# Patient Record
Sex: Female | Born: 1950
Health system: Southern US, Community
[De-identification: ages and names within clinical notes are randomized; demographics above are authoritative.]

## PROBLEM LIST (undated history)

## (undated) DIAGNOSIS — H269 Unspecified cataract: Secondary | ICD-10-CM

## (undated) DIAGNOSIS — K5790 Diverticulosis of intestine, part unspecified, without perforation or abscess without bleeding: Secondary | ICD-10-CM

## (undated) DIAGNOSIS — I10 Essential (primary) hypertension: Secondary | ICD-10-CM

## (undated) DIAGNOSIS — T7840XA Allergy, unspecified, initial encounter: Secondary | ICD-10-CM

## (undated) DIAGNOSIS — C4490 Unspecified malignant neoplasm of skin, unspecified: Secondary | ICD-10-CM

## (undated) DIAGNOSIS — G43909 Migraine, unspecified, not intractable, without status migrainosus: Secondary | ICD-10-CM

## (undated) DIAGNOSIS — D649 Anemia, unspecified: Secondary | ICD-10-CM

## (undated) DIAGNOSIS — Z5189 Encounter for other specified aftercare: Secondary | ICD-10-CM

## (undated) DIAGNOSIS — M1990 Unspecified osteoarthritis, unspecified site: Secondary | ICD-10-CM

## (undated) DIAGNOSIS — M8580 Other specified disorders of bone density and structure, unspecified site: Secondary | ICD-10-CM

## (undated) DIAGNOSIS — K219 Gastro-esophageal reflux disease without esophagitis: Secondary | ICD-10-CM

## (undated) HISTORY — DX: Unspecified cataract: H26.9

## (undated) HISTORY — DX: Gastro-esophageal reflux disease without esophagitis: K21.9

## (undated) HISTORY — DX: Allergy, unspecified, initial encounter: T78.40XA

## (undated) HISTORY — PX: POLYPECTOMY: SHX149

## (undated) HISTORY — DX: Unspecified osteoarthritis, unspecified site: M19.90

## (undated) HISTORY — DX: Anemia, unspecified: D64.9

## (undated) HISTORY — DX: Unspecified malignant neoplasm of skin, unspecified: C44.90

## (undated) HISTORY — DX: Other specified disorders of bone density and structure, unspecified site: M85.80

## (undated) HISTORY — DX: Migraine, unspecified, not intractable, without status migrainosus: G43.909

## (undated) HISTORY — PX: OOPHORECTOMY: SHX86

## (undated) HISTORY — DX: Encounter for other specified aftercare: Z51.89

## (undated) HISTORY — PX: OTHER SURGICAL HISTORY: SHX169

## (undated) HISTORY — PX: COLONOSCOPY: SHX174

## (undated) HISTORY — PX: TONSILLECTOMY: SUR1361

## (undated) HISTORY — DX: Essential (primary) hypertension: I10

## (undated) HISTORY — PX: ABDOMINAL HYSTERECTOMY: SHX81

## (undated) HISTORY — PX: TUBAL LIGATION: SHX77

---

## 2000-11-09 ENCOUNTER — Encounter: Payer: Self-pay | Admitting: Internal Medicine

## 2007-04-20 ENCOUNTER — Emergency Department (HOSPITAL_COMMUNITY): Admission: EM | Admit: 2007-04-20 | Discharge: 2007-04-20 | Payer: Self-pay | Admitting: Emergency Medicine

## 2007-04-25 ENCOUNTER — Encounter: Admission: RE | Admit: 2007-04-25 | Discharge: 2007-07-18 | Payer: Self-pay | Admitting: Orthopedic Surgery

## 2007-11-06 ENCOUNTER — Ambulatory Visit: Payer: Self-pay | Admitting: Internal Medicine

## 2007-11-22 ENCOUNTER — Ambulatory Visit: Payer: Self-pay | Admitting: Internal Medicine

## 2007-12-02 ENCOUNTER — Ambulatory Visit: Payer: Self-pay | Admitting: Internal Medicine

## 2007-12-02 DIAGNOSIS — R030 Elevated blood-pressure reading, without diagnosis of hypertension: Secondary | ICD-10-CM | POA: Insufficient documentation

## 2007-12-03 DIAGNOSIS — J309 Allergic rhinitis, unspecified: Secondary | ICD-10-CM | POA: Insufficient documentation

## 2007-12-03 DIAGNOSIS — R519 Headache, unspecified: Secondary | ICD-10-CM | POA: Insufficient documentation

## 2007-12-03 DIAGNOSIS — Z85828 Personal history of other malignant neoplasm of skin: Secondary | ICD-10-CM | POA: Insufficient documentation

## 2007-12-03 DIAGNOSIS — R51 Headache: Secondary | ICD-10-CM | POA: Insufficient documentation

## 2008-05-14 ENCOUNTER — Ambulatory Visit: Payer: Self-pay | Admitting: Internal Medicine

## 2008-11-11 LAB — CONVERTED CEMR LAB: Pap Smear: NORMAL

## 2009-10-08 ENCOUNTER — Ambulatory Visit: Payer: Self-pay | Admitting: Internal Medicine

## 2009-10-08 DIAGNOSIS — M25559 Pain in unspecified hip: Secondary | ICD-10-CM | POA: Insufficient documentation

## 2009-10-08 LAB — CONVERTED CEMR LAB
ALT: 24 units/L (ref 0–35)
AST: 26 units/L (ref 0–37)
Alkaline Phosphatase: 66 units/L (ref 39–117)
BUN: 13 mg/dL (ref 6–23)
Basophils Absolute: 0 10*3/uL (ref 0.0–0.1)
Bilirubin, Direct: 0.1 mg/dL (ref 0.0–0.3)
Calcium: 9.6 mg/dL (ref 8.4–10.5)
Creatinine, Ser: 0.8 mg/dL (ref 0.4–1.2)
Eosinophils Relative: 1.5 % (ref 0.0–5.0)
GFR calc non Af Amer: 83.2 mL/min (ref >60)
Glucose, Bld: 79 mg/dL (ref 70–99)
HCT: 40.3 % (ref 36.0–46.0)
HDL: 70.3 mg/dL (ref >39.00)
Lymphocytes Relative: 33.5 % (ref 12.0–46.0)
Monocytes Relative: 8.1 % (ref 3.0–12.0)
Neutrophils Relative %: 56.4 % (ref 43.0–77.0)
Platelets: 201 10*3/uL (ref 150.0–400.0)
Potassium: 4.2 meq/L (ref 3.5–5.1)
RDW: 14.2 % (ref 11.5–14.6)
TSH: 0.75 microintl units/mL (ref 0.35–5.50)
Total Bilirubin: 1.1 mg/dL (ref 0.3–1.2)
Triglycerides: 45 mg/dL (ref 0.0–149.0)
VLDL: 9 mg/dL (ref 0.0–40.0)
WBC: 5.8 10*3/uL (ref 4.5–10.5)

## 2009-10-10 ENCOUNTER — Encounter: Payer: Self-pay | Admitting: Internal Medicine

## 2009-11-25 ENCOUNTER — Encounter: Payer: Self-pay | Admitting: Internal Medicine

## 2009-11-26 ENCOUNTER — Encounter: Payer: Self-pay | Admitting: Internal Medicine

## 2009-11-30 ENCOUNTER — Encounter: Payer: Self-pay | Admitting: Internal Medicine

## 2009-12-01 ENCOUNTER — Encounter: Payer: Self-pay | Admitting: Internal Medicine

## 2009-12-01 ENCOUNTER — Telehealth: Payer: Self-pay | Admitting: Internal Medicine

## 2009-12-10 ENCOUNTER — Encounter: Payer: Self-pay | Admitting: Internal Medicine

## 2009-12-10 ENCOUNTER — Encounter: Admission: RE | Admit: 2009-12-10 | Discharge: 2009-12-10 | Payer: Self-pay | Admitting: Obstetrics and Gynecology

## 2009-12-14 ENCOUNTER — Telehealth: Payer: Self-pay | Admitting: Internal Medicine

## 2010-02-08 ENCOUNTER — Telehealth: Payer: Self-pay | Admitting: Internal Medicine

## 2010-02-14 ENCOUNTER — Ambulatory Visit
Admission: RE | Admit: 2010-02-14 | Discharge: 2010-02-14 | Payer: Self-pay | Source: Home / Self Care | Attending: Internal Medicine | Admitting: Internal Medicine

## 2010-02-14 ENCOUNTER — Encounter: Payer: Self-pay | Admitting: Internal Medicine

## 2010-02-14 DIAGNOSIS — G43009 Migraine without aura, not intractable, without status migrainosus: Secondary | ICD-10-CM | POA: Insufficient documentation

## 2010-02-14 DIAGNOSIS — N390 Urinary tract infection, site not specified: Secondary | ICD-10-CM | POA: Insufficient documentation

## 2010-02-19 ENCOUNTER — Encounter: Payer: Self-pay | Admitting: Internal Medicine

## 2010-02-19 ENCOUNTER — Encounter: Payer: Self-pay | Admitting: Obstetrics and Gynecology

## 2010-02-20 ENCOUNTER — Encounter: Payer: Self-pay | Admitting: Internal Medicine

## 2010-02-24 ENCOUNTER — Ambulatory Visit
Admission: RE | Admit: 2010-02-24 | Discharge: 2010-02-24 | Payer: Self-pay | Source: Home / Self Care | Attending: Internal Medicine | Admitting: Internal Medicine

## 2010-02-24 ENCOUNTER — Encounter: Payer: Self-pay | Admitting: Internal Medicine

## 2010-02-24 ENCOUNTER — Other Ambulatory Visit: Payer: Self-pay | Admitting: Internal Medicine

## 2010-02-24 DIAGNOSIS — E876 Hypokalemia: Secondary | ICD-10-CM | POA: Insufficient documentation

## 2010-02-24 DIAGNOSIS — D72829 Elevated white blood cell count, unspecified: Secondary | ICD-10-CM | POA: Insufficient documentation

## 2010-02-24 DIAGNOSIS — K5900 Constipation, unspecified: Secondary | ICD-10-CM | POA: Insufficient documentation

## 2010-02-24 LAB — CBC WITH DIFFERENTIAL/PLATELET
Basophils Absolute: 0 10*3/uL (ref 0.0–0.1)
Basophils Relative: 0.4 % (ref 0.0–3.0)
HCT: 38.7 % (ref 36.0–46.0)
Hemoglobin: 13.3 g/dL (ref 12.0–15.0)
Lymphs Abs: 2.2 10*3/uL (ref 0.7–4.0)
MCHC: 34.4 g/dL (ref 30.0–36.0)
Monocytes Relative: 7.1 % (ref 3.0–12.0)
Neutro Abs: 2.9 10*3/uL (ref 1.4–7.7)
RBC: 4.62 Mil/uL (ref 3.87–5.11)
RDW: 13.5 % (ref 11.5–14.6)

## 2010-02-24 LAB — BASIC METABOLIC PANEL
CO2: 29 mEq/L (ref 19–32)
GFR: 89.88 mL/min (ref >60.00)
Glucose, Bld: 91 mg/dL (ref 70–99)
Potassium: 4.1 mEq/L (ref 3.5–5.1)
Sodium: 138 mEq/L (ref 135–145)

## 2010-03-01 NOTE — Progress Notes (Signed)
    PAP Screening:    Hx Cervical Dysplasia in last 5 yrs? Yes    3 normal PAP smears in last 5 yrs? No    Last PAP smear:  11/26/2009  PAP Smear Results:    Date of Exam:  11/26/2009    Results:  Normal  Mammogram Screening:    Last Mammogram:  11/17/2008  Osteoporosis Risk Assessment:  Risk Factors for Fracture or Low Bone Density:   Race (White or Asian):     yes   Hx of Fractures:       no   FH of Osteoporosis:     no   Hx of falls:       no   Physically inactive:     no   Smoking status:       never   High alcohol use:     no   Low calcium/Vit. D intake:   no   Corticosteroid use:     no   Heparin use:       no   Thyroid disease:     no   Rheumatoid Arthritis:     no  Immunization & Chemoprophylaxis:    Tetanus vaccine: given  (01/30/2006)

## 2010-03-01 NOTE — Letter (Signed)
Summary: Letter from Patient with Health Screening Results  Letter from Patient with Health Screening Results   Imported By: Lennie Odor 11/29/2009 09:10:10  _____________________________________________________________________  External Attachment:    Type:   Image     Comment:   External Document

## 2010-03-01 NOTE — Assessment & Plan Note (Signed)
Summary: CPX/ MAY COME FASTING OR COME THE NEXT DAY FOR LABS/NWS  #--Rm 8   Vital Signs:  Patient profile:   60 year old female Height:      65 inches Weight:      165.04 pounds BMI:     27.56 O2 Sat:      98 % on Room air Temp:     97.4 degrees F oral Pulse rate:   72 / minute Pulse rhythm:   regular Resp:     16 per minute BP sitting:   140 / 80  (left arm) Cuff size:   regular  Vitals Entered By: Mervin Kung CMA Duncan Dull) (October 08, 2009 1:17 PM)  Nutrition Counseling: Patient's BMI is greater than 25 and therefore counseled on weight management options.  O2 Flow:  Room air CC: Rm 8  Pt here for physical, fasting. Also has bilateral hip pain., Preventive Care Is Patient Diabetic? No   Primary Care Provider:  Etta Grandchild MD  CC:  Rm 8  Pt here for physical, fasting. Also has bilateral hip pain., and Preventive Care.  History of Present Illness: She returns for a complete physical but she offers the complaint of bilateral hip pain for many years that has worsened some recently.  Preventive Screening-Counseling & Management  Alcohol-Tobacco     Alcohol drinks/day: 0     Smoking Status: never     Passive Smoke Exposure: no     Tobacco Counseling: not indicated; no tobacco use  Hep-HIV-STD-Contraception     Hepatitis Risk: no risk noted     HIV Risk: no risk noted     STD Risk: no risk noted     Dental Visit-last 6 months yes     Dental Care Counseling: to seek dental care; no dental care within six months     SBE monthly: yes     SBE Education/Counseling: to perform regular SBE     Sun Exposure-Excessive: no  Safety-Violence-Falls     Seat Belt Use: yes     Helmet Use: yes     Firearms in the Home: no firearms in the home     Smoke Detectors: yes     Violence in the Home: no risk noted     Sexual Abuse: no      Sexual History:  currently monogamous.        Drug Use:  never.        Blood Transfusions:  no.    Clinical Review  Panels:  Prevention   Last Mammogram:  Normal Bilateral (11/17/2008)   Last Pap Smear:  Normal (11/11/2008)   Last Colonoscopy:  Location:  [facility].  (11/22/2007)  Immunizations   Last Tetanus Booster:  given (01/30/2006)   Allergies: 1)  ! Tetracycline 2)  ! Morphine 3)  ! 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 Family History of Colon CA 1st degree relative <60 Family  History Hypertension Family History Kidney disease  Social History: Reviewed history from 12/02/2007 and no changes required. Occupation: Engineer, civil (consulting) in [employer] Married Never Smoked Alcohol use-no Drug use-no Regular exercise-yes Hepatitis Risk:  no risk noted HIV Risk:  no risk noted STD Risk:  no risk noted Dental Care w/in 6 mos.:  yes Sun Exposure-Excessive:  no Seat Belt Use:  yes Sexual History:  currently monogamous Drug Use:  never Blood Transfusions:  no  Review of Systems  The patient denies anorexia, fever, weight loss, weight gain, chest pain, syncope, dyspnea on exertion, peripheral edema, prolonged cough, headaches, hemoptysis, abdominal pain, melena, hematochezia, severe indigestion/heartburn, hematuria, suspicious skin lesions, difficulty walking,  depression, enlarged lymph nodes, angioedema, and breast masses.   MS:  Complains of joint pain and stiffness; denies joint redness, joint swelling, loss of strength, low back pain, mid back pain, muscle aches, and muscle weakness.  Physical Exam  General:  alert, well-developed, well-nourished, well-hydrated, appropriate dress, normal appearance, healthy-appearing, and cooperative to examination.   Head:  Normocephalic and atraumatic without obvious abnormalities. No apparent alopecia or balding. Eyes:  No corneal or conjunctival inflammation noted. EOMI. Perrla. Funduscopic exam benign, without hemorrhages, exudates or papilledema. Vision grossly normal. Mouth:  Oral mucosa and oropharynx without lesions or exudates.  Teeth in good repair. Neck:  supple, full ROM, and no masses.   Lungs:  Normal respiratory effort, chest expands symmetrically. Lungs are clear to auscultation, no crackles or wheezes. Heart:  Normal rate and regular rhythm. S1 and S2 normal without gallop, murmur, click, rub or other extra sounds. Abdomen:  soft, non-tender, normal bowel sounds, no distention, no masses, no hepatomegaly, and no splenomegaly.   Msk:  normal ROM, no joint tenderness, no joint swelling, no joint warmth, no redness over joints, no joint deformities, no joint instability, no crepitation, and no muscle atrophy.   Pulses:  R and L carotid,radial,femoral,dorsalis pedis and posterior tibial pulses are full and equal bilaterally Extremities:  No clubbing, cyanosis, edema, or deformity noted with normal full range of motion of all joints.   Neurologic:  alert & oriented X3, cranial nerves II-XII intact, strength normal in all extremities, sensation intact to light touch, sensation intact to pinprick, gait normal, and DTRs symmetrical and normal.   Skin:  turgor normal, color normal, no rashes, no suspicious lesions, no ecchymoses, no petechiae, and no purpura.   Cervical Nodes:  no anterior cervical  adenopathy and no posterior cervical adenopathy.   Axillary Nodes:  no R axillary adenopathy and no L axillary adenopathy.   Inguinal Nodes:  no R inguinal adenopathy and no L inguinal adenopathy.   Psych:  Cognition and judgment appear intact. Alert and cooperative with normal attention span and concentration. No apparent delusions, illusions, hallucinations   Impression & Recommendations:  Problem # 1:  ROUTINE GENERAL MEDICAL EXAM@HEALTH  CARE FACL (ICD-V70.0) Assessment New  Orders: Venipuncture (29562) TLB-Lipid Panel (80061-LIPID) TLB-BMP (Basic Metabolic Panel-BMET) (80048-METABOL) TLB-CBC Platelet - w/Differential (85025-CBCD) TLB-Hepatic/Liver Function Pnl (80076-HEPATIC) TLB-TSH (Thyroid Stimulating Hormone) (13086-VHQ) Radiology Referral (Radiology)  Mammogram: Normal Bilateral (11/17/2008) Pap smear: Normal (11/11/2008) Colonoscopy: Location:  [facility].   (11/22/2007) Td Booster: given (01/30/2006)   Next Colonoscopy due:: 11/2012 (11/22/2007)  Discussed using sunscreen, use of alcohol, drug use, self breast exam, routine dental care, routine eye care, schedule for GYN exam, routine physical exam, seat belts, multiple vitamins, osteoporosis prevention, adequate calcium intake in diet, recommendations for immunizations, mammograms and Pap smears.  Discussed exercise and checking cholesterol.  Discussed  gun safety, safe sex, and contraception.  Problem # 2:  HIP PAIN, BILATERAL (ICD-719.45) Assessment: New will look for DJD, AVN, etc The following medications were removed from the medication list:    Ibuprofen 200 Mg Tabs (Ibuprofen) .Marland Kitchen..Marland Kitchen Two to three tablets daily as needed. Her updated medication list for this problem includes:    Celebrex 200 Mg Caps (Celecoxib) ..... One by mouth once daily for pain  Orders: T-Bilateral Hip w/Pelvis, min 2 views (73520TC)  Problem # 3:  ELEVATED BLOOD PRESSURE WITHOUT DIAGNOSIS OF HYPERTENSION  (ICD-796.2) Assessment: Unchanged  Orders: Venipuncture (57846) TLB-Lipid Panel (80061-LIPID) TLB-BMP (Basic Metabolic Panel-BMET) (80048-METABOL) TLB-CBC Platelet - w/Differential (85025-CBCD) TLB-Hepatic/Liver Function Pnl (80076-HEPATIC) TLB-TSH (Thyroid Stimulating Hormone) (84443-TSH)  BP today: 140/80 Prior BP: 132/90 (05/14/2008)  Instructed in low sodium diet (DASH Handout) and behavior modification.    Complete Medication List: 1)  Zrytec  2)  Tylenol Cold Severe Congestion 30-15-200-325 Mg Tabs (Pseudoephedrine-dm-gg-apap) 3)  Glucosamine-chondroitin Caps (Glucosamine-chondroit-vit c-mn) .... Take 1 capsule by mouth two times a day. 4)  Celebrex 200 Mg Caps (Celecoxib) .... One by mouth once daily for pain  Colorectal Screening:  Current Recommendations:    Hemoccult: NEG X 1 today  PAP Screening:    Hx Cervical Dysplasia in last 5 yrs? No    3 normal PAP smears in last 5 yrs? Yes    Last PAP smear:  11/11/2008    Reviewed PAP smear recommendations:  patient defers to GYN provider  PAP Smear Results:    Date of Exam:  11/11/2008    Results:  Normal  Mammogram Screening:    Last Mammogram:  11/17/2008    Reviewed Mammogram recommendations:  mammogram ordered  Mammogram Results:    Date of Exam:  11/17/2008    Results:  Normal Bilateral  Osteoporosis Risk Assessment:  Risk Factors for Fracture or Low Bone Density:   Race (White or Asian):     yes   Hx of Fractures:       no   FH of Osteoporosis:     no   Hx of falls:       no   Physically inactive:     no   Smoking status:       never   High alcohol use:     no   High caffeine use:     no   Low calcium/Vit. D intake:   no   Corticosteroid use:     no   Thyroid replacement:     no   Dilantin use:       no   Heparin use:       no   Thyroid disease:     no   Rheumatoid Arthritis:     no   Parathyroid disease:     no  Immunization & Chemoprophylaxis:    Tetanus vaccine: given   (01/30/2006)  Patient Instructions: 1)  Please schedule a follow-up appointment in 4 months. 2)  It is important that you exercise regularly at least 20 minutes 5 times a week. If you develop chest pain, have severe difficulty breathing, or feel very tired , stop exercising immediately and seek medical attention. 3)  You need to lose weight. Consider a lower calorie diet and regular exercise.  4)  Schedule your mammogram. 5)  You need to have a Pap Smear to prevent cervical cancer. 6)  Take 650-1000mg  of Tylenol every 4-6 hours as needed for relief of pain or comfort of fever  AVOID taking more than 4000mg   in a 24 hour period (can cause liver damage in higher doses). Prescriptions: CELEBREX 200 MG CAPS (CELECOXIB) One by mouth once daily for pain  #60 x 0   Entered and Authorized by:   Etta Grandchild MD   Signed by:   Etta Grandchild MD on 10/08/2009   Method used:   Samples Given   RxID:   [id number]   Current Allergies (reviewed today): ! TETRACYCLINE ! MORPHINE ! DEMEROL   Preventive Care Screening     Pt states she has not had pap or mammogram x 2 yrs. 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 Nicki Guadalajara Fergerson CMA (AAMA)  October 08, 2009 1:20 PM

## 2010-03-01 NOTE — Letter (Signed)
Summary: Results Follow-up Letter  [facility]  [address] [place], [place] [address]   Phone: [phone]  Fax: [phone]    10/10/2009  [address] [place], [place]  [address]  Dear Ms. Delay,   The following are the results of your recent test(s):  Test     Result     Hip Xray     normal CBC       normal Liver/kidney   normal Thyroid     normal  _________________________________________________________  Please call for an appointment as needed _________________________________________________________ _________________________________________________________ _________________________________________________________  Sincerely,  Sanda Linger MD [facility]

## 2010-03-01 NOTE — Letter (Signed)
Summary: Lipid Letter  [facility]  [address] [place], [place] [address]   Phone: [phone]  Fax: [phone]    10/10/2009  [facility] [address] [place], [place]  [address]  Dear Joanna Anthony:  We have carefully reviewed your last lipid profile from  and the results are noted below with a summary of recommendations for lipid management.    Cholesterol:       242     Goal: <200   HDL "good" Cholesterol:   57.84     Goal: >50 great!   LDL "bad" Cholesterol:   158     Goal: <130   Triglycerides:       45.0     Goal: <150        TLC Diet (Therapeutic Lifestyle Change): Saturated Fats & Transfatty acids should be kept < 7% of total calories ***Reduce Saturated Fats Polyunstaurated Fat can be up to 10% of total calories Monounsaturated Fat Fat can be up to 20% of total calories Total Fat should be no greater than 25-35% of total calories Carbohydrates should be 50-60% of total calories Protein should be approximately 15% of total calories Fiber should be at least 20-30 grams a day ***Increased fiber may help lower LDL Total Cholesterol should be < 200mg /day Consider adding plant stanol/sterols to diet (example: Benacol spread) ***A higher intake of unsaturated fat may reduce Triglycerides and Increase HDL    Adjunctive Measures (may lower LIPIDS and reduce risk of Heart Attack) include: Aerobic Exercise (20-30 minutes 3-4 times a week) Limit Alcohol Consumption Weight Reduction Aspirin 75-81 mg a day by mouth (if not allergic or contraindicated) Dietary Fiber 20-30 grams a day by mouth     Current Medications: 1)    Zrytec  2)    Tylenol Cold Severe Congestion 30-15-200-325 Mg Tabs (Pseudoephedrine-dm-gg-apap) 3)    Glucosamine-chondroitin  Caps (Glucosamine-chondroit-vit c-mn) .... Take 1 capsule by mouth two times a day. 4)    Celebrex 200 Mg Caps (Celecoxib) .... One by mouth once daily for pain  If you have any questions, please call. We  appreciate being able to work with you.   Sincerely,    [facility] Etta Grandchild MD

## 2010-03-01 NOTE — Progress Notes (Signed)
    PAP Screening:    Last PAP smear:  11/26/2009  Mammogram Screening:    Last Mammogram:  11/26/2009  Mammogram Results:    Date of Exam:  11/26/2009    Results:  Normal Bilateral  Osteoporosis Risk Assessment:  Risk Factors for Fracture or Low Bone Density:   Race (White or Asian):     yes   Hx of Fractures:       no   FH of Osteoporosis:     no   Hx of falls:       no   Physically inactive:     no   Smoking status:       never   High alcohol use:     no   Low calcium/Vit. D intake:   no   Corticosteroid use:     no   Heparin use:       no   Thyroid disease:     no   Rheumatoid Arthritis:     no  Immunization & Chemoprophylaxis:    Tetanus vaccine: given  (01/30/2006)

## 2010-03-03 NOTE — Progress Notes (Signed)
    PAP Screening:    Last PAP smear:  11/26/2009  Mammogram Screening:    Last Mammogram:  12/10/2009  Mammogram Results:    Date of Exam:  12/10/2009    Results:  Normal Bilateral  Osteoporosis Risk Assessment:  Risk Factors for Fracture or Low Bone Density:   Race (White or Asian):     yes   Hx of Fractures:       no   FH of Osteoporosis:     no   Hx of falls:       no   Physically inactive:     no   Smoking status:       never   High alcohol use:     no   Low calcium/Vit. D intake:   no   Corticosteroid use:     no   Heparin use:       no   Thyroid disease:     no   Rheumatoid Arthritis:     no  Immunization & Chemoprophylaxis:    Tetanus vaccine: given  (01/30/2006)

## 2010-03-03 NOTE — Assessment & Plan Note (Signed)
Summary: 4 month follow up-lb   Vital Signs:  Patient profile:   60 year old female Menstrual status:  hysterectomy Height:      65 inches Weight:      171 pounds BMI:     28.56 O2 Sat:      97 % on Room air Temp:     97.1 degrees F oral Pulse rate:   64 / minute Pulse rhythm:   regular Resp:     16 per minute BP sitting:   130 / 86  (left arm) Cuff size:   regular  Vitals Entered By: Rock Nephew CMA (February 14, 2010 1:16 PM)  Nutrition Counseling: Patient's BMI is greater than 25 and therefore counseled on weight management options.  O2 Flow:  Room air CC: nausea, Headaches Is Patient Diabetic? No Pain Assessment Patient in pain? yes     Location: head     Menstrual Status hysterectomy Last PAP Result Normal   Primary Care Provider:  Etta Grandchild MD  CC:  nausea and Headaches.  History of Present Illness:  Headaches      This is a 60 year old woman who presents with Headaches.  The symptoms began 2 days ago.  On a scale of 1 to 10, the intensity is described as a 4.  The patient reports nausea, photophobia, and phonophobia, but denies vomiting, sweats, tearing of eyes, nasal congestion, sinus pain, and sinus pressure.  The headache is described as constant, throbbing, and band-like.  The location of the pain is bitemporal.  High-risk features (red flags) include age >50 years.  The patient denies the following high-risk features: rash, trauma, pain worse with exertion, new type of headache, immunosuppression, concomitant infection, and anticoagulation use.  The headaches are precipitated by stress.  Prior treatment has included acetaminophen.    Preventive Screening-Counseling & Management  Alcohol-Tobacco     Alcohol drinks/day: 0     Alcohol Counseling: not indicated; patient does not drink     Smoking Status: never     Passive Smoke Exposure: no     Tobacco Counseling: not indicated; no tobacco use  Hep-HIV-STD-Contraception     Hepatitis Risk: no  risk noted     HIV Risk: no risk noted     STD Risk: no risk noted     Dental Visit-last 6 months yes     Dental Care Counseling: to seek dental care; no dental care within six months     SBE monthly: yes     SBE Education/Counseling: to perform regular SBE     Sun Exposure-Excessive: no      Sexual History:  currently monogamous.        Drug Use:  never.        Blood Transfusions:  no.    Clinical Review Panels:  Prevention   Last Mammogram:  Normal Bilateral (12/10/2009)   Last Pap Smear:  Normal (11/26/2009)   Last Colonoscopy:  Location:  [facility].  (11/22/2007)  Immunizations   Last Tetanus Booster:  given (01/30/2006)  Lipid Management   Cholesterol:  242 (10/08/2009)   HDL (good cholesterol):  70.30 (10/08/2009)  Diabetes Management   Creatinine:  0.8 (10/08/2009)  CBC   WBC:  5.8 (10/08/2009)   RBC:  4.74 (10/08/2009)   Hgb:  13.9 (10/08/2009)   Hct:  40.3 (10/08/2009)   Platelets:  201.0 (10/08/2009)   MCV  85.0 (10/08/2009)   MCHC  34.6 (10/08/2009)   RDW  14.2 (10/08/2009)  PMN:  56.4 (10/08/2009)   Lymphs:  33.5 (10/08/2009)   Monos:  8.1 (10/08/2009)   Eosinophils:  1.5 (10/08/2009)   Basophil:  0.5 (10/08/2009)  Complete Metabolic Panel   Glucose:  79 (10/08/2009)   Sodium:  142 (10/08/2009)   Potassium:  4.2 (10/08/2009)   Chloride:  104 (10/08/2009)   CO2:  28 (10/08/2009)   BUN:  13 (10/08/2009)   Creatinine:  0.8 (10/08/2009)   Albumin:  4.4 (10/08/2009)   Total Protein:  7.1 (10/08/2009)   Calcium:  9.6 (10/08/2009)   Total Bili:  1.1 (10/08/2009)   Alk Phos:  66 (10/08/2009)   SGPT (ALT):  24 (10/08/2009)   SGOT (AST):  26 (10/08/2009)   Medications Prior to Update: 1)  Zrytec 2)  Tylenol Cold Severe Congestion 30-15-200-325 Mg Tabs (Pseudoephedrine-Dm-Gg-Apap) 3)  Glucosamine-Chondroitin  Caps (Glucosamine-Chondroit-Vit C-Mn) .... Take 1 Capsule By Mouth Two Times A Day. 4)  Celebrex 200 Mg Caps (Celecoxib) ....  One By Mouth Once Daily For Pain  Current Medications (verified): 1)  Calcium + D 600 Chewables .... Once Daily 2)  Asa 325mg  .... As Needed 3)  Excedrin Migraine 250-250-65 Mg Tabs (Aspirin-Acetaminophen-Caffeine) .... As Needed 4)  Azo Tabs 95 Mg Tabs (Phenazopyridine Hcl) .... As Needed  Allergies (verified): 1)  ! Tetracycline 2)  ! Morphine 3)  ! 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 Family History of Colon CA 1st degree relative <60 Family History Hypertension Family History Kidney disease  Social History: Reviewed history from 12/02/2007 and no changes required. 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   GU:  Complains of dysuria, urinary frequency, and urinary hesitancy; denies abnormal vaginal bleeding, discharge, hematuria, incontinence, and nocturia. Neuro:  Complains of headaches; denies brief paralysis, difficulty with concentration,  disturbances in coordination, memory loss, numbness, poor balance, seizures, sensation of room spinning, tingling, tremors, visual disturbances, and weakness.  Physical Exam  General:  alert, well-developed, well-nourished, well-hydrated, appropriate dress, normal appearance, healthy-appearing, cooperative to examination, and good hygiene.   Head:  normocephalic, atraumatic, no abnormalities observed, and no abnormalities palpated.   Eyes:  vision grossly intact, pupils equal, pupils round, pupils reactive to light, pupils react to accomodation, no injection, and no nystagmus.   Ears:  R ear normal and L ear normal.   Nose:  External nasal examination shows no deformity or inflammation. Nasal mucosa are pink and moist without lesions or exudates. Mouth:  Oral mucosa and oropharynx without lesions or exudates.  Teeth in good repair. Neck:  supple, full ROM, no masses, no thyromegaly, no thyroid nodules or tenderness, no JVD, normal carotid upstroke, no carotid bruits, no cervical lymphadenopathy, and no neck tenderness.   Lungs:  normal respiratory effort, no intercostal retractions, no accessory muscle use, normal breath sounds, no dullness, no fremitus, no crackles, and no wheezes.   Heart:  normal rate, regular rhythm, no murmur, no gallop, no rub, and no JVD.   Abdomen:  Bowel sounds positive,abdomen soft and non-tender without masses, organomegaly or hernias noted. Msk:  normal ROM, no joint tenderness, no joint swelling, no joint warmth, no redness over joints, no joint deformities, no joint instability, and no crepitation.   Pulses:  R and L carotid,radial,femoral,dorsalis pedis and posterior tibial pulses are full and equal bilaterally Extremities:  No clubbing, cyanosis, edema, or deformity noted with normal full range of motion of all joints.   Neurologic:  No cranial nerve deficits noted. Station and gait are normal. Plantar reflexes are down-going bilaterally. DTRs are symmetrical  throughout. Sensory, motor and coordinative functions appear intact. Skin:  Intact without suspicious lesions or rashes Cervical Nodes:  No lymphadenopathy noted Psych:  Cognition and judgment appear intact. Alert and cooperative with normal attention span and concentration. No apparent delusions, illusions, hallucinations   Impression & Recommendations:  Problem # 1:  COMMON MIGRAINE (ICD-346.10) Assessment New she was given sumavel dose-pro in the office today - it did not help her headache and made her feel more nauseated The following medications were removed from the medication list:    Celebrex 200 Mg Caps (Celecoxib) ..... One by mouth once daily for pain Her updated medication list for this problem includes:    Excedrin Migraine 250-250-65 Mg Tabs (Aspirin-acetaminophen-caffeine) .Marland Kitchen... As needed  Problem # 2:  UTI (ICD-599.0) Assessment: New  Orders: TLB-Udip w/ Micro (81001-URINE) T-Urine Culture (Spectrum Order) ([id number])  Her updated medication list for this problem includes:    Cipro 500 Mg Tab (Ciprofloxacin hcl) .Marland Kitchen... Take 1 tablet by mouth morning and night x 5 days  Encouraged to push clear liquids, get enough rest, and take acetaminophen as needed. To be seen in 10 days if no improvement, sooner if worse.  Complete Medication List: 1)  Calcium + D 600 Chewables  .... Once daily 2)  Asa 325mg   .... As needed 3)  Excedrin Migraine 250-250-65 Mg Tabs (Aspirin-acetaminophen-caffeine) .... As needed 4)  Azo Tabs 95 Mg Tabs (Phenazopyridine hcl) .... As needed 5)  Cipro 500 Mg Tab (Ciprofloxacin hcl) .... Take 1 tablet by mouth morning and night x 5 days 6)  Ondansetron Hcl 4 Mg Tabs (Ondansetron hcl) .Marland Kitchen.. 1-2 by mouth three times a day as needed for nausea  Patient Instructions: 1)  Please schedule a follow-up appointment in 2 weeks. 2)  Take your antibiotic as prescribed until ALL of it is gone, but stop if you develop a rash or swelling and contact our office  as soon as possible. Prescriptions: ONDANSETRON HCL 4 MG TABS (ONDANSETRON HCL) 1-2 by mouth three times a day as needed for nausea  #35 x 2   Entered and Authorized by:   Etta Grandchild MD   Signed by:   Etta Grandchild MD on 02/14/2010   Method used:   Electronically to        [facility]* (retail)       [address]       [address] Shipping/mailing       [place], [place]  [address]       Ph: [phone]       Fax: [phone]   RxID:   [id number] CIPRO 500 MG TAB (CIPROFLOXACIN HCL) Take 1  tablet by mouth morning and night X 5 days  #10 x 0   Entered and Authorized by:   Etta Grandchild MD   Signed by:   Etta Grandchild MD on 02/14/2010   Method used:   Electronically to        [facility]* (retail)       [address]       [address] Shipping/mailing       [place], [place]  [address]       Ph: [phone]       Fax: [phone]   RxID:   [id number]    Orders Added: 1)  TLB-Udip w/ Micro [81001-URINE] 2)  T-Urine Culture (Spectrum Order) [[id number]] 3)  Est. Patient Level IV [42595]  Appended Document: 4 month follow up-lb    Clinical Lists Changes  Orders: Added new Service order of UA Dipstick w/o Micro (manual) (63875) - Signed Added new Service order of Specimen Handling (99000) - Signed Observations: Added new observation of PH URINE: 5.0  (02/14/2010 15:05) Added new observation of SPEC GR URIN: <1.005  (02/14/2010 15:05) Added new observation of APPEARANCE U: Clear  (02/14/2010 15:05) Added new observation of UA COLOR: yellow  (02/14/2010 15:05) Added new observation of WBC DIPSTK U: moderate  (02/14/2010 15:05) Added new observation of NITRITE URN: negative  (02/14/2010 15:05) Added new observation of UROBILINOGEN: 0.2  (02/14/2010 15:05) Added new observation of PROTEIN, URN: negative  (02/14/2010 15:05) Added new observation of BLOOD UR DIP: negative  (02/14/2010 15:05) Added new observation of  KETONES URN: negative  (02/14/2010 15:05) Added new observation of BILIRUBIN UR: negative  (02/14/2010 15:05) Added new observation of GLUCOSE, URN: negative  (02/14/2010 15:05)      Laboratory Results   Urine Tests  Date/Time Received: Rock Nephew CMA  February 14, 2010 3:06 PM   Routine Urinalysis   Color: yellow Appearance: Clear Glucose: negative   (Normal Range: Negative) Bilirubin: negative   (Normal Range: Negative) Ketone: negative   (Normal Range: Negative) Spec. Gravity: <1.005   (Normal Range: 1.003-1.035) Blood: negative   (Normal Range: Negative) pH: 5.0   (Normal Range: 5.0-8.0) Protein: negative   (Normal Range: Negative) Urobilinogen: 0.2   (Normal Range: 0-1) Nitrite: negative   (Normal Range: Negative) Leukocyte Esterace: moderate   (Normal Range: Negative)

## 2010-03-03 NOTE — Letter (Signed)
Summary: Results Follow-up Letter  [facility]  [address] [place], [place] [address]   Phone: [phone]  Fax: [phone]    02/24/2010  [address] [place], [place]  [address]  Dear Ms. Oddo,   The following are the results of your recent test(s):  Test     Result     Xray       moderate stool CBC       normal Liver/kidney   normal Thyroid     normal   _________________________________________________________  Please call for an appointment as directed _________________________________________________________ _________________________________________________________ _________________________________________________________  Sincerely,  Sanda Linger MD [facility]

## 2010-03-03 NOTE — Assessment & Plan Note (Signed)
Summary: post hospital/cd   Vital Signs:  Patient profile:   60 year old female Menstrual status:  hysterectomy Height:      65 inches Weight:      169 pounds O2 Sat:      96 % on Room air Temp:     98.1 degrees F oral Pulse rate:   74 / minute Pulse rhythm:   regular Resp:     16 per minute BP supine:   140 / 90  (right arm) BP sitting:   140 / 86  (left arm) Cuff size:   regular  Vitals Entered By: Rock Nephew CMA (February 24, 2010 2:04 PM)  O2 Flow:  Room air CC: follow-up visit   Primary Care Provider:  Etta Grandchild MD  CC:  follow-up visit.  History of Present Illness: She returns for f/up and tells me that she was in [place] 5 days ago and developed abd pain and consitpation so she went to an ER and a CT scan showed stool retention so she was disimpacted. Her abd pain has resloved but she is feeling constipated again. She has been taking Zofran about once a day. Also, her ER labs showed a slightly high WBC count and a slightly low K+ level.  Preventive Screening-Counseling & Management  Alcohol-Tobacco     Alcohol drinks/day: 0     Alcohol Counseling: not indicated; patient does not drink     Smoking Status: never     Passive Smoke Exposure: no     Tobacco Counseling: not indicated; no tobacco use  Hep-HIV-STD-Contraception     Hepatitis Risk: no risk noted     HIV Risk: no risk noted     STD Risk: no risk noted     Dental Visit-last 6 months yes     Dental Care Counseling: to seek dental care; no dental care within six months     SBE monthly: yes     SBE Education/Counseling: to perform regular SBE     Sun Exposure-Excessive: no      Sexual History:  currently monogamous.        Drug Use:  never.        Blood Transfusions:  no.    Clinical Review Panels:  Prevention   Last Mammogram:  Normal Bilateral (12/10/2009)   Last Pap Smear:  Normal (11/26/2009)   [facility]  [facility]  Immunizations  Last Tetanus Booster:  given (01/30/2006)  Lipid Management   Cholesterol:  242 (10/08/2009)   HDL (good cholesterol):  70.30 (10/08/2009)  Diabetes Management   Creatinine:  0.8 (10/08/2009)  CBC   WBC:  5.8 (10/08/2009)   RBC:  4.74 (10/08/2009)   Hgb:  13.9 (10/08/2009)   Hct:  40.3 (10/08/2009)   Platelets:  201.0 (10/08/2009)   MCV  85.0 (10/08/2009)   MCHC  34.6 (10/08/2009)   RDW  14.2 (10/08/2009)   PMN:  56.4 (10/08/2009)   Lymphs:  33.5 (10/08/2009)   Monos:  8.1 (10/08/2009)   Eosinophils:  1.5 (10/08/2009)   Basophil:  0.5 (10/08/2009)  Complete Metabolic Panel   Glucose:  79 (10/08/2009)   Sodium:  142 (10/08/2009)   Potassium:  4.2 (10/08/2009)   Chloride:  104 (10/08/2009)   CO2:  28 (10/08/2009)   BUN:  13 (10/08/2009)   Creatinine:  0.8 (10/08/2009)   Albumin:  4.4 (10/08/2009)   Total Protein:  7.1 (10/08/2009)   Calcium:  9.6 (10/08/2009)   Total Bili:  1.1 (10/08/2009)   Alk Phos:  66 (10/08/2009)   SGPT (ALT):  24 (10/08/2009)   SGOT (AST):  26 (10/08/2009)   Medications Prior to Update: 1)  Calcium + D 600 Chewables .... Once Daily 2)  Asa 325mg  .... As Needed 3)  Excedrin Migraine 250-250-65 Mg Tabs (Aspirin-Acetaminophen-Caffeine) .... As Needed 4)  Azo Tabs 95 Mg Tabs (Phenazopyridine Hcl) .... As Needed 5)  Ondansetron Hcl 4 Mg Tabs (Ondansetron Hcl) .Marland Kitchen.. 1-2 By Mouth Three Times A Day As Needed For Nausea  Current Medications (verified): 1)  Calcium + D 600 Chewables .... Once Daily 2)  Asa 325mg  .... As Needed 3)  Excedrin Migraine 250-250-65 Mg Tabs (Aspirin-Acetaminophen-Caffeine) .... As Needed 4)  Azo Tabs 95 Mg Tabs (Phenazopyridine Hcl) .... As Needed 5)  Ondansetron Hcl 4 Mg Tabs (Ondansetron Hcl) .Marland Kitchen.. 1-2 By Mouth Three Times A Day As Needed For Nausea 6)  Miralax  Powd (Polyethylene Glycol 3350) .... One Scoop in 8 Ounces of Juice Once Daily  Allergies (verified): 1)  ! Tetracycline 2)  ! Morphine 3)  ! 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 Family History of Colon CA 1st degree relative <60 Family History Hypertension Family History Kidney disease  Social History: Reviewed history from 12/02/2007 and no changes required. 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   General:  Denies chills, fatigue, fever, loss of appetite, malaise, sleep disorder, sweats, and weakness. GI:  Complains of constipation; denies abdominal pain, bloody stools, diarrhea, indigestion, loss of appetite, nausea, vomiting, vomiting blood, and yellowish skin color.  Physical Exam  General:  alert, well-developed, well-nourished, well-hydrated, appropriate dress, normal appearance, healthy-appearing, and cooperative to examination.   Head:  normocephalic, atraumatic, no abnormalities observed, and no abnormalities palpated.     Eyes:  vision grossly intact and pupils equal.   Mouth:  Oral mucosa and oropharynx without lesions or exudates.  Teeth in good repair. Neck:  supple, full ROM, no masses, no thyromegaly, no thyroid nodules or tenderness, no JVD, normal carotid upstroke, no carotid bruits, no cervical lymphadenopathy, and no neck tenderness.   Lungs:  normal respiratory effort, no intercostal retractions, no accessory muscle use, normal breath sounds, no dullness, no fremitus, no crackles, and no wheezes.   Heart:  normal rate, regular rhythm, no murmur, no gallop, no rub, and no JVD.   Abdomen:  soft, non-tender, normal bowel sounds, no distention, no masses, no guarding, no rigidity, no rebound tenderness, no abdominal hernia, no inguinal hernia, no hepatomegaly, and no splenomegaly.   Msk:  normal ROM, no joint tenderness, no joint swelling, no joint warmth, no redness over joints, no joint deformities, no  joint instability, and no crepitation.   Pulses:  R and L carotid,radial,femoral,dorsalis pedis and posterior tibial pulses are full and equal bilaterally Extremities:  No clubbing, cyanosis, edema, or deformity noted with normal full range of motion of all joints.   Neurologic:  No cranial nerve deficits noted. Station and gait are normal. Plantar reflexes are down-going bilaterally. DTRs are symmetrical throughout. Sensory, motor and coordinative functions appear intact. Skin:  Intact without suspicious lesions or rashes Cervical Nodes:  No lymphadenopathy noted Psych:  Cognition and judgment appear intact. Alert and cooperative with normal attention span and concentration. No apparent delusions, illusions, hallucinations   Impression & Recommendations:  Problem # 1:  CONSTIPATION (ICD-564.00) Assessment New she will need to stop zofran if this continues Her updated medication list for this problem includes:    Miralax Powd (Polyethylene glycol 3350) ..... One scoop in 8 ounces of juice once  daily  Orders: T-Abdomen 2-view (74020TC) Venipuncture (16109) TLB-BMP (Basic Metabolic Panel-BMET) (80048-METABOL) TLB-CBC Platelet - w/Differential (85025-CBCD) TLB-TSH (Thyroid Stimulating Hormone) (84443-TSH) TLB-Magnesium (Mg) (83735-MG)  Problem # 2:  HYPOKALEMIA (ICD-276.8) Assessment: New  Orders: Venipuncture (60454) TLB-BMP (Basic Metabolic Panel-BMET) (80048-METABOL) TLB-CBC Platelet - w/Differential (85025-CBCD) TLB-TSH (Thyroid Stimulating Hormone) (84443-TSH) TLB-Magnesium (Mg) (83735-MG)  Problem # 3:  LEUKOCYTOSIS (ICD-288.60) Assessment: New  Orders: Venipuncture (09811) TLB-BMP (Basic Metabolic Panel-BMET) (80048-METABOL) TLB-CBC Platelet - w/Differential (85025-CBCD) TLB-TSH (Thyroid Stimulating Hormone) (84443-TSH) TLB-Magnesium (Mg) (83735-MG)  Complete Medication List: 1)  Calcium + D 600 Chewables  .... Once daily 2)  Asa 325mg   .... As needed 3)  Excedrin Migraine 250-250-65 Mg Tabs (Aspirin-acetaminophen-caffeine) .... As needed 4)  Azo Tabs 95 Mg Tabs (Phenazopyridine hcl) .... As needed 5)  Ondansetron Hcl 4 Mg Tabs (Ondansetron hcl) .Marland Kitchen.. 1-2 by mouth three times a day as needed for nausea 6)  Miralax Powd (Polyethylene glycol 3350) .... One scoop in 8 ounces of juice once daily  Patient Instructions: 1)  Please schedule a follow-up appointment in 1 month. 2)  It is important that you exercise regularly at least 20 minutes 5 times a week. If you develop chest pain, have severe difficulty breathing, or feel very tired , stop exercising immediately and seek medical attention. Prescriptions: MIRALAX  POWD (POLYETHYLENE GLYCOL 3350) One scoop in 8 ounces of juice once daily  #30 days x 11   Entered and Authorized by:   Etta Grandchild MD   Signed by:   Etta Grandchild MD on 02/24/2010   Method used:   Electronically to        [facility]* (retail)       [address]       [address] Shipping/mailing        [place], [place]  [address]       Ph: [phone]       Fax: [phone]   RxID:   [id number]    Orders Added: 1)  T-Abdomen 2-view [74020TC] 2)  Venipuncture [66440] 3)  TLB-BMP (Basic Metabolic Panel-BMET) [80048-METABOL] 4)  TLB-CBC Platelet - w/Differential [85025-CBCD] 5)  TLB-TSH (Thyroid Stimulating Hormone) [84443-TSH] 6)  TLB-Magnesium (Mg) [83735-MG] 7)  Est. Patient Level IV [34742]

## 2010-03-09 NOTE — Progress Notes (Signed)
Summary: BP from home  BP from home   Imported By: Lester Ridgeland 03/02/2010 09:00:48  _____________________________________________________________________  External Attachment:    Type:   Image     Comment:   External Document

## 2010-03-28 ENCOUNTER — Ambulatory Visit: Payer: Self-pay | Admitting: Internal Medicine

## 2011-02-08 LAB — HM PAP SMEAR: HM Pap smear: NORMAL

## 2011-04-04 LAB — HM MAMMOGRAPHY: HM Mammogram: NORMAL

## 2011-04-05 LAB — HM MAMMOGRAPHY: HM Mammogram: NORMAL

## 2011-08-01 ENCOUNTER — Other Ambulatory Visit: Payer: Self-pay | Admitting: Internal Medicine

## 2011-12-25 ENCOUNTER — Encounter: Payer: Self-pay | Admitting: Internal Medicine

## 2011-12-29 ENCOUNTER — Encounter: Payer: Self-pay | Admitting: Internal Medicine

## 2012-01-05 ENCOUNTER — Encounter: Payer: Self-pay | Admitting: Internal Medicine

## 2012-01-12 ENCOUNTER — Encounter: Payer: Self-pay | Admitting: Internal Medicine

## 2012-01-12 ENCOUNTER — Ambulatory Visit (INDEPENDENT_AMBULATORY_CARE_PROVIDER_SITE_OTHER): Payer: BC Managed Care – PPO | Admitting: Internal Medicine

## 2012-01-12 ENCOUNTER — Other Ambulatory Visit (INDEPENDENT_AMBULATORY_CARE_PROVIDER_SITE_OTHER): Payer: BC Managed Care – PPO

## 2012-01-12 VITALS — BP 120/74 | HR 74 | Temp 98.8°F | Resp 16 | Wt 169.5 lb

## 2012-01-12 DIAGNOSIS — Z0000 Encounter for general adult medical examination without abnormal findings: Secondary | ICD-10-CM

## 2012-01-12 LAB — COMPREHENSIVE METABOLIC PANEL
AST: 25 U/L (ref 0–37)
Albumin: 4.3 g/dL (ref 3.5–5.2)
Alkaline Phosphatase: 68 U/L (ref 39–117)
Potassium: 4 mEq/L (ref 3.5–5.1)
Sodium: 138 mEq/L (ref 135–145)
Total Protein: 7.5 g/dL (ref 6.0–8.3)

## 2012-01-12 LAB — LIPID PANEL
HDL: 65.3 mg/dL (ref >39.00)
Total CHOL/HDL Ratio: 3
VLDL: 13 mg/dL (ref 0.0–40.0)

## 2012-01-12 LAB — URINALYSIS, ROUTINE W REFLEX MICROSCOPIC
Hgb urine dipstick: NEGATIVE
Nitrite: NEGATIVE
Total Protein, Urine: NEGATIVE
Urobilinogen, UA: 0.2 (ref 0.0–1.0)

## 2012-01-12 LAB — CBC WITH DIFFERENTIAL/PLATELET
Basophils Relative: 0.6 % (ref 0.0–3.0)
Eosinophils Absolute: 0.2 10*3/uL (ref 0.0–0.7)
Lymphs Abs: 1.9 10*3/uL (ref 0.7–4.0)
MCHC: 33.8 g/dL (ref 30.0–36.0)
MCV: 81.1 fl (ref 78.0–100.0)
Monocytes Absolute: 0.4 10*3/uL (ref 0.1–1.0)
Neutrophils Relative %: 48.8 % (ref 43.0–77.0)
Platelets: 247 10*3/uL (ref 150.0–400.0)

## 2012-01-12 LAB — LDL CHOLESTEROL, DIRECT: Direct LDL: 135.7 mg/dL

## 2012-01-12 NOTE — Progress Notes (Signed)
  Subjective:    Patient ID: Joanna Anthony, female    DOB: 08-Apr-1950, 61 y.o.   MRN: [id number]  HPI  She returns for a physical and tells me that she feels well.  Review of Systems  Constitutional: Negative.   HENT: Negative.   Eyes: Negative.   Respiratory: Negative.   Cardiovascular: Negative.   Gastrointestinal: Negative.   Genitourinary: Negative.   Musculoskeletal: Negative.   Skin: Negative.   Neurological: Negative.   Hematological: Negative.   Psychiatric/Behavioral: Negative.        Objective:   Physical Exam  Vitals reviewed. Constitutional: She is oriented to person, place, and time. She appears well-developed and well-nourished. No distress.  HENT:  Head: Normocephalic and atraumatic.  Mouth/Throat: Oropharynx is clear and moist. No oropharyngeal exudate.  Eyes: Conjunctivae normal are normal. Right eye exhibits no discharge. Left eye exhibits no discharge. No scleral icterus.  Neck: Normal range of motion. Neck supple. No JVD present. No tracheal deviation present. No thyromegaly present.  Cardiovascular: Normal rate, regular rhythm, normal heart sounds and intact distal pulses.  Exam reveals no gallop and no friction rub.   No murmur heard. Pulmonary/Chest: Effort normal and breath sounds normal. No stridor. No respiratory distress. She has no wheezes. She has no rales. She exhibits no tenderness.  Abdominal: Soft. Bowel sounds are normal. She exhibits no distension and no mass. There is no tenderness. There is no rebound and no guarding.  Musculoskeletal: Normal range of motion. She exhibits no edema and no tenderness.  Lymphadenopathy:    She has no cervical adenopathy.  Neurological: She is oriented to person, place, and time.  Skin: Skin is warm and dry. No rash noted. She is not diaphoretic. No erythema. No pallor.  Psychiatric: She has a normal mood and affect. Her behavior is normal. Judgment and thought content normal.      Lab Results   Component Value Date   WBC 5.7 02/24/2010   HGB 13.3 02/24/2010   HCT 38.7 02/24/2010   PLT 208.0 02/24/2010   GLUCOSE 91 02/24/2010   CHOL 242* 10/08/2009   TRIG 45.0 10/08/2009   HDL 70.30 10/08/2009   LDLDIRECT 158.0 10/08/2009   ALT 24 10/08/2009   AST 26 10/08/2009   NA 138 02/24/2010   K 4.1 02/24/2010   CL 101 02/24/2010   CREATININE 0.7 02/24/2010   BUN 9 02/24/2010   CO2 29 02/24/2010   TSH 1.41 02/24/2010      Assessment & Plan:

## 2012-01-12 NOTE — Patient Instructions (Signed)
Preventive Care for Adults, Female A healthy lifestyle and preventive care can promote health and wellness. Preventive health guidelines for women include the following key practices.  A routine yearly physical is a good way to check with your caregiver about your health and preventive screening. It is a chance to share any concerns and updates on your health, and to receive a thorough exam.  Visit your dentist for a routine exam and preventive care every 6 months. Brush your teeth twice a day and floss once a day. Good oral hygiene prevents tooth decay and gum disease.  The frequency of eye exams is based on your age, health, family medical history, use of contact lenses, and other factors. Follow your caregiver's recommendations for frequency of eye exams.  Eat a healthy diet. Foods like vegetables, fruits, whole grains, low-fat dairy products, and lean protein foods contain the nutrients you need without too many calories. Decrease your intake of foods high in solid fats, added sugars, and salt. Eat the right amount of calories for you.Get information about a proper diet from your caregiver, if necessary.  Regular physical exercise is one of the most important things you can do for your health. Most adults should get at least 150 minutes of moderate-intensity exercise (any activity that increases your heart rate and causes you to sweat) each week. In addition, most adults need muscle-strengthening exercises on 2 or more days a week.  Maintain a healthy weight. The body mass index (BMI) is a screening tool to identify possible weight problems. It provides an estimate of body fat based on height and weight. Your caregiver can help determine your BMI, and can help you achieve or maintain a healthy weight.For adults 20 years and older:  A BMI below 18.5 is considered underweight.  A BMI of 18.5 to 24.9 is normal.  A BMI of 25 to 29.9 is considered overweight.  A BMI of 30 and above is  considered obese.  Maintain normal blood lipids and cholesterol levels by exercising and minimizing your intake of saturated fat. Eat a balanced diet with plenty of fruit and vegetables. Blood tests for lipids and cholesterol should begin at age 20 and be repeated every 5 years. If your lipid or cholesterol levels are high, you are over 50, or you are at high risk for heart disease, you may need your cholesterol levels checked more frequently.Ongoing high lipid and cholesterol levels should be treated with medicines if diet and exercise are not effective.  If you smoke, find out from your caregiver how to quit. If you do not use tobacco, do not start.  If you are pregnant, do not drink alcohol. If you are breastfeeding, be very cautious about drinking alcohol. If you are not pregnant and choose to drink alcohol, do not exceed 1 drink per day. One drink is considered to be 12 ounces (355 mL) of beer, 5 ounces (148 mL) of wine, or 1.5 ounces (44 mL) of liquor.  Avoid use of street drugs. Do not share needles with anyone. Ask for help if you need support or instructions about stopping the use of drugs.  High blood pressure causes heart disease and increases the risk of stroke. Your blood pressure should be checked at least every 1 to 2 years. Ongoing high blood pressure should be treated with medicines if weight loss and exercise are not effective.  If you are 55 to 59 years old, ask your caregiver if you should take aspirin to prevent strokes.  Diabetes   screening involves taking a blood sample to check your fasting blood sugar level. This should be done once every 3 years, after age 45, if you are within normal weight and without risk factors for diabetes. Testing should be considered at a younger age or be carried out more frequently if you are overweight and have at least 1 risk factor for diabetes.  Breast cancer screening is essential preventive care for women. You should practice "breast  self-awareness." This means understanding the normal appearance and feel of your breasts and may include breast self-examination. Any changes detected, no matter how small, should be reported to a caregiver. Women in their 20s and 30s should have a clinical breast exam (CBE) by a caregiver as part of a regular health exam every 1 to 3 years. After age 40, women should have a CBE every year. Starting at age 40, women should consider having a mammography (breast X-ray test) every year. Women who have a family history of breast cancer should talk to their caregiver about genetic screening. Women at a high risk of breast cancer should talk to their caregivers about having magnetic resonance imaging (MRI) and a mammography every year.  The Pap test is a screening test for cervical cancer. A Pap test can show cell changes on the cervix that might become cervical cancer if left untreated. A Pap test is a procedure in which cells are obtained and examined from the lower end of the uterus (cervix).  Women should have a Pap test starting at age 21.  Between ages 21 and 29, Pap tests should be repeated every 2 years.  Beginning at age 30, you should have a Pap test every 3 years as long as the past 3 Pap tests have been normal.  Some women have medical problems that increase the chance of getting cervical cancer. Talk to your caregiver about these problems. It is especially important to talk to your caregiver if a new problem develops soon after your last Pap test. In these cases, your caregiver may recommend more frequent screening and Pap tests.  The above recommendations are the same for women who have or have not gotten the vaccine for human papillomavirus (HPV).  If you had a hysterectomy for a problem that was not cancer or a condition that could lead to cancer, then you no longer need Pap tests. Even if you no longer need a Pap test, a regular exam is a good idea to make sure no other problems are  starting.  If you are between ages 65 and 70, and you have had normal Pap tests going back 10 years, you no longer need Pap tests. Even if you no longer need a Pap test, a regular exam is a good idea to make sure no other problems are starting.  If you have had past treatment for cervical cancer or a condition that could lead to cancer, you need Pap tests and screening for cancer for at least 20 years after your treatment.  If Pap tests have been discontinued, risk factors (such as a new sexual partner) need to be reassessed to determine if screening should be resumed.  The HPV test is an additional test that may be used for cervical cancer screening. The HPV test looks for the virus that can cause the cell changes on the cervix. The cells collected during the Pap test can be tested for HPV. The HPV test could be used to screen women aged 30 years and older, and should   be used in women of any age who have unclear Pap test results. After the age of 30, women should have HPV testing at the same frequency as a Pap test.  Colorectal cancer can be detected and often prevented. Most routine colorectal cancer screening begins at the age of 50 and continues through age 75. However, your caregiver may recommend screening at an earlier age if you have risk factors for colon cancer. On a yearly basis, your caregiver may provide home test kits to check for hidden blood in the stool. Use of a small camera at the end of a tube, to directly examine the colon (sigmoidoscopy or colonoscopy), can detect the earliest forms of colorectal cancer. Talk to your caregiver about this at age 50, when routine screening begins. Direct examination of the colon should be repeated every 5 to 10 years through age 75, unless early forms of pre-cancerous polyps or small growths are found.  Hepatitis C blood testing is recommended for all people born from 1945 through 1965 and any individual with known risks for hepatitis C.  Practice  safe sex. Use condoms and avoid high-risk sexual practices to reduce the spread of sexually transmitted infections (STIs). STIs include gonorrhea, chlamydia, syphilis, trichomonas, herpes, HPV, and human immunodeficiency virus (HIV). Herpes, HIV, and HPV are viral illnesses that have no cure. They can result in disability, cancer, and death. Sexually active women aged 25 and younger should be checked for chlamydia. Older women with new or multiple partners should also be tested for chlamydia. Testing for other STIs is recommended if you are sexually active and at increased risk.  Osteoporosis is a disease in which the bones lose minerals and strength with aging. This can result in serious bone fractures. The risk of osteoporosis can be identified using a bone density scan. Women ages 65 and over and women at risk for fractures or osteoporosis should discuss screening with their caregivers. Ask your caregiver whether you should take a calcium supplement or vitamin D to reduce the rate of osteoporosis.  Menopause can be associated with physical symptoms and risks. Hormone replacement therapy is available to decrease symptoms and risks. You should talk to your caregiver about whether hormone replacement therapy is right for you.  Use sunscreen with sun protection factor (SPF) of 30 or more. Apply sunscreen liberally and repeatedly throughout the day. You should seek shade when your shadow is shorter than you. Protect yourself by wearing long sleeves, pants, a wide-brimmed hat, and sunglasses year round, whenever you are outdoors.  Once a month, do a whole body skin exam, using a mirror to look at the skin on your back. Notify your caregiver of new moles, moles that have irregular borders, moles that are larger than a pencil eraser, or moles that have changed in shape or color.  Stay current with required immunizations.  Influenza. You need a dose every fall (or winter). The composition of the flu vaccine  changes each year, so being vaccinated once is not enough.  Pneumococcal polysaccharide. You need 1 to 2 doses if you smoke cigarettes or if you have certain chronic medical conditions. You need 1 dose at age 65 (or older) if you have never been vaccinated.  Tetanus, diphtheria, pertussis (Tdap, Td). Get 1 dose of Tdap vaccine if you are younger than age 65, are over 65 and have contact with an infant, are a healthcare worker, are pregnant, or simply want to be protected from whooping cough. After that, you need a Td   booster dose every 10 years. Consult your caregiver if you have not had at least 3 tetanus and diphtheria-containing shots sometime in your life or have a deep or dirty wound.  HPV. You need this vaccine if you are a woman age 26 or younger. The vaccine is given in 3 doses over 6 months.  Measles, mumps, rubella (MMR). You need at least 1 dose of MMR if you were born in 1957 or later. You may also need a second dose.  Meningococcal. If you are age 19 to 21 and a first-year college student living in a residence hall, or have one of several medical conditions, you need to get vaccinated against meningococcal disease. You may also need additional booster doses.  Zoster (shingles). If you are age 60 or older, you should get this vaccine.  Varicella (chickenpox). If you have never had chickenpox or you were vaccinated but received only 1 dose, talk to your caregiver to find out if you need this vaccine.  Hepatitis A. You need this vaccine if you have a specific risk factor for hepatitis A virus infection or you simply wish to be protected from this disease. The vaccine is usually given as 2 doses, 6 to 18 months apart.  Hepatitis B. You need this vaccine if you have a specific risk factor for hepatitis B virus infection or you simply wish to be protected from this disease. The vaccine is given in 3 doses, usually over 6 months. Preventive Services / Frequency Ages 19 to 39  Blood  pressure check.** / Every 1 to 2 years.  Lipid and cholesterol check.** / Every 5 years beginning at age 20.  Clinical breast exam.** / Every 3 years for women in their 20s and 30s.  Pap test.** / Every 2 years from ages 21 through 29. Every 3 years starting at age 30 through age 65 or 70 with a history of 3 consecutive normal Pap tests.  HPV screening.** / Every 3 years from ages 30 through ages 65 to 70 with a history of 3 consecutive normal Pap tests.  Hepatitis C blood test.** / For any individual with known risks for hepatitis C.  Skin self-exam. / Monthly.  Influenza immunization.** / Every year.  Pneumococcal polysaccharide immunization.** / 1 to 2 doses if you smoke cigarettes or if you have certain chronic medical conditions.  Tetanus, diphtheria, pertussis (Tdap, Td) immunization. / A one-time dose of Tdap vaccine. After that, you need a Td booster dose every 10 years.  HPV immunization. / 3 doses over 6 months, if you are 26 and younger.  Measles, mumps, rubella (MMR) immunization. / You need at least 1 dose of MMR if you were born in 1957 or later. You may also need a second dose.  Meningococcal immunization. / 1 dose if you are age 19 to 21 and a first-year college student living in a residence hall, or have one of several medical conditions, you need to get vaccinated against meningococcal disease. You may also need additional booster doses.  Varicella immunization.** / Consult your caregiver.  Hepatitis A immunization.** / Consult your caregiver. 2 doses, 6 to 18 months apart.  Hepatitis B immunization.** / Consult your caregiver. 3 doses usually over 6 months. Ages 40 to 64  Blood pressure check.** / Every 1 to 2 years.  Lipid and cholesterol check.** / Every 5 years beginning at age 20.  Clinical breast exam.** / Every year after age 40.  Mammogram.** / Every year beginning at age 40   and continuing for as long as you are in good health. Consult with your  caregiver.  Pap test.** / Every 3 years starting at age 30 through age 65 or 70 with a history of 3 consecutive normal Pap tests.  HPV screening.** / Every 3 years from ages 30 through ages 65 to 70 with a history of 3 consecutive normal Pap tests.  Fecal occult blood test (FOBT) of stool. / Every year beginning at age 50 and continuing until age 75. You may not need to do this test if you get a colonoscopy every 10 years.  Flexible sigmoidoscopy or colonoscopy.** / Every 5 years for a flexible sigmoidoscopy or every 10 years for a colonoscopy beginning at age 50 and continuing until age 75.  Hepatitis C blood test.** / For all people born from 1945 through 1965 and any individual with known risks for hepatitis C.  Skin self-exam. / Monthly.  Influenza immunization.** / Every year.  Pneumococcal polysaccharide immunization.** / 1 to 2 doses if you smoke cigarettes or if you have certain chronic medical conditions.  Tetanus, diphtheria, pertussis (Tdap, Td) immunization.** / A one-time dose of Tdap vaccine. After that, you need a Td booster dose every 10 years.  Measles, mumps, rubella (MMR) immunization. / You need at least 1 dose of MMR if you were born in 1957 or later. You may also need a second dose.  Varicella immunization.** / Consult your caregiver.  Meningococcal immunization.** / Consult your caregiver.  Hepatitis A immunization.** / Consult your caregiver. 2 doses, 6 to 18 months apart.  Hepatitis B immunization.** / Consult your caregiver. 3 doses, usually over 6 months. Ages 65 and over  Blood pressure check.** / Every 1 to 2 years.  Lipid and cholesterol check.** / Every 5 years beginning at age 20.  Clinical breast exam.** / Every year after age 40.  Mammogram.** / Every year beginning at age 40 and continuing for as long as you are in good health. Consult with your caregiver.  Pap test.** / Every 3 years starting at age 30 through age 65 or 70 with a 3  consecutive normal Pap tests. Testing can be stopped between 65 and 70 with 3 consecutive normal Pap tests and no abnormal Pap or HPV tests in the past 10 years.  HPV screening.** / Every 3 years from ages 30 through ages 65 or 70 with a history of 3 consecutive normal Pap tests. Testing can be stopped between 65 and 70 with 3 consecutive normal Pap tests and no abnormal Pap or HPV tests in the past 10 years.  Fecal occult blood test (FOBT) of stool. / Every year beginning at age 50 and continuing until age 75. You may not need to do this test if you get a colonoscopy every 10 years.  Flexible sigmoidoscopy or colonoscopy.** / Every 5 years for a flexible sigmoidoscopy or every 10 years for a colonoscopy beginning at age 50 and continuing until age 75.  Hepatitis C blood test.** / For all people born from 1945 through 1965 and any individual with known risks for hepatitis C.  Osteoporosis screening.** / A one-time screening for women ages 65 and over and women at risk for fractures or osteoporosis.  Skin self-exam. / Monthly.  Influenza immunization.** / Every year.  Pneumococcal polysaccharide immunization.** / 1 dose at age 65 (or older) if you have never been vaccinated.  Tetanus, diphtheria, pertussis (Tdap, Td) immunization. / A one-time dose of Tdap vaccine if you are over   65 and have contact with an infant, are a healthcare worker, or simply want to be protected from whooping cough. After that, you need a Td booster dose every 10 years.  Varicella immunization.** / Consult your caregiver.  Meningococcal immunization.** / Consult your caregiver.  Hepatitis A immunization.** / Consult your caregiver. 2 doses, 6 to 18 months apart.  Hepatitis B immunization.** / Check with your caregiver. 3 doses, usually over 6 months. ** Family history and personal history of risk and conditions may change your caregiver's recommendations. Document Released: 03/14/2001 Document Revised: 04/10/2011  Document Reviewed: 06/13/2010 [employer] Patient Information 2013 [employer].  

## 2012-01-12 NOTE — Assessment & Plan Note (Signed)
Exam done Labs ordered Vaccines were reviewed Pt ed material was given 

## 2012-05-01 ENCOUNTER — Encounter: Payer: Self-pay | Admitting: Internal Medicine

## 2012-05-03 ENCOUNTER — Ambulatory Visit (INDEPENDENT_AMBULATORY_CARE_PROVIDER_SITE_OTHER)
Admission: RE | Admit: 2012-05-03 | Discharge: 2012-05-03 | Disposition: A | Discharge status: Home / Self Care | Payer: BC Managed Care – PPO | Source: Ambulatory Visit | Attending: Internal Medicine | Admitting: Internal Medicine

## 2012-05-03 ENCOUNTER — Encounter: Payer: Self-pay | Admitting: Internal Medicine

## 2012-05-03 ENCOUNTER — Ambulatory Visit (INDEPENDENT_AMBULATORY_CARE_PROVIDER_SITE_OTHER): Payer: BC Managed Care – PPO | Admitting: Internal Medicine

## 2012-05-03 VITALS — BP 122/88 | HR 93 | Temp 98.1°F | Resp 16

## 2012-05-03 DIAGNOSIS — G5601 Carpal tunnel syndrome, right upper limb: Secondary | ICD-10-CM

## 2012-05-03 DIAGNOSIS — M25531 Pain in right wrist: Secondary | ICD-10-CM

## 2012-05-03 DIAGNOSIS — G5631 Lesion of radial nerve, right upper limb: Secondary | ICD-10-CM

## 2012-05-03 DIAGNOSIS — G5630 Lesion of radial nerve, unspecified upper limb: Secondary | ICD-10-CM

## 2012-05-03 DIAGNOSIS — M25539 Pain in unspecified wrist: Secondary | ICD-10-CM | POA: Insufficient documentation

## 2012-05-03 DIAGNOSIS — G5600 Carpal tunnel syndrome, unspecified upper limb: Secondary | ICD-10-CM | POA: Insufficient documentation

## 2012-05-03 NOTE — Patient Instructions (Signed)

## 2012-05-03 NOTE — Progress Notes (Signed)
Subjective:    Patient ID: Joanna Anthony, female    DOB: 1950-05-30, 62 y.o.   MRN: [id number]  Arthritis Presents for initial visit. The disease course has been worsening. The condition has lasted for 2 months. She complains of pain. She reports no stiffness, joint swelling or joint warmth. Affected location: right wrist. Her pain is at a severity of 2/10. Pertinent negatives include no diarrhea, dry eyes, dry mouth, dysuria, fatigue, fever, pain at night, pain while resting, rash, Raynaud's syndrome, uveitis or weight loss. There is no history of chronic back pain, lupus, osteoarthritis, psoriasis or rheumatoid arthritis.  Her pertinent risk factors include overuse. Past treatments include NSAIDs. The treatment provided moderate relief. Factors aggravating her arthritis include activity. Compliance with prior treatments has been good.      Review of Systems  Constitutional: Negative.  Negative for fever, weight loss and fatigue.  HENT: Negative.   Eyes: Negative.   Respiratory: Negative.   Cardiovascular: Negative.   Gastrointestinal: Negative.  Negative for diarrhea.  Endocrine: Negative.   Genitourinary: Negative.  Negative for dysuria.  Musculoskeletal: Positive for arthritis. Negative for myalgias, back pain, joint swelling, gait problem and stiffness.  Skin: Negative.  Negative for rash.  Allergic/Immunologic: Negative.   Neurological: Positive for numbness (and tingling in her right hand). Negative for dizziness and weakness.  Hematological: Negative.   Psychiatric/Behavioral: Negative.        Objective:   Physical Exam  Vitals reviewed. Constitutional: She is oriented to person, place, and time. She appears well-developed and well-nourished. No distress.  HENT:  Head: Normocephalic and atraumatic.  Mouth/Throat: Oropharynx is clear and moist. No oropharyngeal exudate.  Eyes: Conjunctivae are normal. Right eye exhibits no discharge. Left eye exhibits no discharge. No  scleral icterus.  Neck: Normal range of motion. Neck supple. No JVD present. No tracheal deviation present. No thyromegaly present.  Cardiovascular: Normal rate, regular rhythm, normal heart sounds and intact distal pulses.  Exam reveals no gallop and no friction rub.   No murmur heard. Pulmonary/Chest: Effort normal and breath sounds normal. No stridor. No respiratory distress. She has no wheezes. She has no rales. She exhibits no tenderness.  Abdominal: Soft. Bowel sounds are normal. She exhibits no distension and no mass. There is no tenderness. There is no rebound and no guarding.  Musculoskeletal: Normal range of motion. She exhibits no edema and no tenderness.       Right wrist: She exhibits deformity (positive Tinels and Phalens tests). She exhibits normal range of motion, no tenderness, no bony tenderness, no swelling, no effusion, no crepitus and no laceration.       Left wrist: Normal.       Right hand: Normal. She exhibits normal range of motion, no tenderness, no bony tenderness, normal two-point discrimination, normal capillary refill, no deformity, no laceration and no swelling. Normal sensation noted. Normal strength noted.       Left hand: Normal. Normal sensation noted.  Lymphadenopathy:    She has no cervical adenopathy.  Neurological: She is oriented to person, place, and time.  Skin: Skin is warm and dry. No rash noted. She is not diaphoretic. No erythema. No pallor.  Psychiatric: She has a normal mood and affect. Her behavior is normal. Judgment and thought content normal.     Lab Results  Component Value Date   WBC 4.8 01/12/2012   HGB 12.5 01/12/2012   HCT 37.1 01/12/2012   PLT 247.0 01/12/2012   GLUCOSE 107* 01/12/2012   CHOL  213* 01/12/2012   TRIG 65.0 01/12/2012   HDL 65.30 01/12/2012   LDLDIRECT 135.7 01/12/2012   ALT 21 01/12/2012   AST 25 01/12/2012   NA 138 01/12/2012   K 4.0 01/12/2012   CL 103 01/12/2012   CREATININE 0.8 01/12/2012   BUN 12  01/12/2012   CO2 27 01/12/2012   TSH 1.08 01/12/2012       Assessment & Plan:

## 2012-05-03 NOTE — Assessment & Plan Note (Signed)
She is already wearing a splint, she will continue nsaids I have asked her to ger NCS and EMG done

## 2012-05-03 NOTE — Assessment & Plan Note (Addendum)
Plain xray is normal She will continue nsaids

## 2012-05-23 LAB — HM MAMMOGRAPHY: HM Mammogram: NORMAL

## 2012-05-28 ENCOUNTER — Encounter: Payer: Self-pay | Admitting: Internal Medicine

## 2012-05-28 ENCOUNTER — Ambulatory Visit (INDEPENDENT_AMBULATORY_CARE_PROVIDER_SITE_OTHER): Payer: BC Managed Care – PPO | Admitting: Neurology

## 2012-05-28 ENCOUNTER — Ambulatory Visit (INDEPENDENT_AMBULATORY_CARE_PROVIDER_SITE_OTHER): Payer: BC Managed Care – PPO

## 2012-05-28 DIAGNOSIS — R209 Unspecified disturbances of skin sensation: Secondary | ICD-10-CM

## 2012-05-28 DIAGNOSIS — G5630 Lesion of radial nerve, unspecified upper limb: Secondary | ICD-10-CM | POA: Insufficient documentation

## 2012-05-28 DIAGNOSIS — Z0289 Encounter for other administrative examinations: Secondary | ICD-10-CM

## 2012-05-28 DIAGNOSIS — G5631 Lesion of radial nerve, right upper limb: Secondary | ICD-10-CM

## 2012-05-28 NOTE — Procedures (Signed)
History: 62 years old right-handed Caucasian female presenting with dorsum hand numbness since February 2014, involving spaces between third and fourth fingers,  Examination: Bilateral finger extension, wrist extension grip was normal, she has decreased sensation along the web space between second and third, third and fourth fingers on the right dorsum hand,  Nerve conduction study:  Bilateral ulnar, median sensory and motor responses were normal  Electromyography:  Selected needle examination of right upper extremity muscles and right cervical paraspinal muscles were performed.  Needle examination of right extensor digitorum communis, brachialis, biceps, triceps was normal.  There is no spontaneous activity at the right cervical paraspinal muscles, right C6, C5  In conclusion: This is a normal study. 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

## 2012-05-28 NOTE — Addendum Note (Signed)
Addended by: Etta Grandchild on: 05/28/2012 06:55 PM   Modules accepted: Orders

## 2012-05-30 ENCOUNTER — Encounter: Payer: Self-pay | Admitting: Internal Medicine

## 2012-06-18 ENCOUNTER — Ambulatory Visit: Payer: BC Managed Care – PPO | Admitting: Occupational Therapy

## 2012-09-18 ENCOUNTER — Encounter: Payer: Self-pay | Admitting: *Deleted

## 2012-09-26 ENCOUNTER — Encounter: Payer: Self-pay | Admitting: Internal Medicine

## 2012-10-02 ENCOUNTER — Encounter: Payer: Self-pay | Admitting: Internal Medicine

## 2012-11-05 ENCOUNTER — Encounter: Payer: Self-pay | Admitting: Internal Medicine

## 2012-11-21 ENCOUNTER — Encounter: Payer: Self-pay | Admitting: Internal Medicine

## 2013-01-14 ENCOUNTER — Encounter: Payer: Self-pay | Admitting: Internal Medicine

## 2013-01-14 ENCOUNTER — Ambulatory Visit (INDEPENDENT_AMBULATORY_CARE_PROVIDER_SITE_OTHER): Payer: 59 | Admitting: Internal Medicine

## 2013-01-14 ENCOUNTER — Other Ambulatory Visit (INDEPENDENT_AMBULATORY_CARE_PROVIDER_SITE_OTHER): Payer: 59

## 2013-01-14 ENCOUNTER — Ambulatory Visit (AMBULATORY_SURGERY_CENTER): Payer: Self-pay

## 2013-01-14 VITALS — Ht 66.5 in | Wt 158.2 lb

## 2013-01-14 VITALS — BP 130/78 | HR 70 | Temp 98.1°F | Resp 16 | Ht 65.0 in | Wt 158.0 lb

## 2013-01-14 DIAGNOSIS — R7309 Other abnormal glucose: Secondary | ICD-10-CM | POA: Insufficient documentation

## 2013-01-14 DIAGNOSIS — Z0000 Encounter for general adult medical examination without abnormal findings: Secondary | ICD-10-CM

## 2013-01-14 DIAGNOSIS — Z800 Family history of malignant neoplasm of digestive organs: Secondary | ICD-10-CM

## 2013-01-14 LAB — CBC WITH DIFFERENTIAL/PLATELET
Basophils Absolute: 0 10*3/uL (ref 0.0–0.1)
HCT: 35.4 % — ABNORMAL LOW (ref 36.0–46.0)
Lymphs Abs: 1.7 10*3/uL (ref 0.7–4.0)
MCV: 78 fl (ref 78.0–100.0)
Monocytes Absolute: 0.3 10*3/uL (ref 0.1–1.0)
Platelets: 255 10*3/uL (ref 150.0–400.0)
RDW: 15.9 % — ABNORMAL HIGH (ref 11.5–14.6)

## 2013-01-14 LAB — COMPREHENSIVE METABOLIC PANEL
ALT: 22 U/L (ref 0–35)
AST: 25 U/L (ref 0–37)
Alkaline Phosphatase: 61 U/L (ref 39–117)
Sodium: 137 mEq/L (ref 135–145)
Total Bilirubin: 0.7 mg/dL (ref 0.3–1.2)
Total Protein: 7.2 g/dL (ref 6.0–8.3)

## 2013-01-14 LAB — LIPID PANEL
LDL Cholesterol: 108 mg/dL — ABNORMAL HIGH (ref 0–99)
VLDL: 12.6 mg/dL (ref 0.0–40.0)

## 2013-01-14 LAB — TSH: TSH: 0.81 u[IU]/mL (ref 0.35–5.50)

## 2013-01-14 LAB — HEMOGLOBIN A1C: Hgb A1c MFr Bld: 5.7 % (ref 4.6–6.5)

## 2013-01-14 MED ORDER — MOVIPREP 100 G PO SOLR: ORAL | Status: DC

## 2013-01-14 NOTE — Progress Notes (Signed)
   Subjective:    Patient ID: Joanna Anthony, female    DOB: [birth year], 62 y.o.   MRN: [id number]  HPI  She returns for a physical and she tells me that she feels well and offers no complaints.  Review of Systems  All other systems reviewed and are negative.       Objective:   Physical Exam  Vitals reviewed. Constitutional: She is oriented to person, place, and time. She appears well-developed and well-nourished. No distress.  HENT:  Head: Normocephalic and atraumatic.  Mouth/Throat: Oropharynx is clear and moist. No oropharyngeal exudate.  Eyes: Conjunctivae are normal. Right eye exhibits no discharge. Left eye exhibits no discharge. No scleral icterus.  Neck: Normal range of motion. Neck supple. No JVD present. No tracheal deviation present. No thyromegaly present.  Cardiovascular: Normal rate, regular rhythm, normal heart sounds and intact distal pulses.  Exam reveals no gallop and no friction rub.   No murmur heard. Pulmonary/Chest: Effort normal and breath sounds normal. No stridor. No respiratory distress. She has no wheezes. She has no rales. She exhibits no tenderness.  Abdominal: Soft. Bowel sounds are normal. She exhibits no distension and no mass. There is no tenderness. There is no rebound and no guarding.  Musculoskeletal: Normal range of motion. She exhibits no edema and no tenderness.  Lymphadenopathy:    She has no cervical adenopathy.  Neurological: She is oriented to person, place, and time.  Skin: Skin is warm. No rash noted. She is not diaphoretic. No erythema. No pallor.  Psychiatric: She has a normal mood and affect. Her behavior is normal. Judgment and thought content normal.     Lab Results  Component Value Date   WBC 4.8 01/12/2012   HGB 12.5 01/12/2012   HCT 37.1 01/12/2012   PLT 247.0 01/12/2012   GLUCOSE 107* 01/12/2012   CHOL 213* 01/12/2012   TRIG 65.0 01/12/2012   HDL 65.30 01/12/2012   LDLDIRECT 135.7 01/12/2012   ALT 21 01/12/2012   AST 25 01/12/2012   NA 138 01/12/2012   K 4.0 01/12/2012   CL 103 01/12/2012   CREATININE 0.8 01/12/2012   BUN 12 01/12/2012   CO2 27 01/12/2012   TSH 1.08 01/12/2012       Assessment & Plan:

## 2013-01-14 NOTE — Patient Instructions (Signed)
Preventive Care for Adults, Female A healthy lifestyle and preventive care can promote health and wellness. Preventive health guidelines for women include the following key practices.  A routine yearly physical is a good way to check with your caregiver about your health and preventive screening. It is a chance to share any concerns and updates on your health, and to receive a thorough exam.  Visit your dentist for a routine exam and preventive care every 6 months. Brush your teeth twice a day and floss once a day. Good oral hygiene prevents tooth decay and gum disease.  The frequency of eye exams is based on your age, health, family medical history, use of contact lenses, and other factors. Follow your caregiver's recommendations for frequency of eye exams.  Eat a healthy diet. Foods like vegetables, fruits, whole grains, low-fat dairy products, and lean protein foods contain the nutrients you need without too many calories. Decrease your intake of foods high in solid fats, added sugars, and salt. Eat the right amount of calories for you.Get information about a proper diet from your caregiver, if necessary.  Regular physical exercise is one of the most important things you can do for your health. Most adults should get at least 150 minutes of moderate-intensity exercise (any activity that increases your heart rate and causes you to sweat) each week. In addition, most adults need muscle-strengthening exercises on 2 or more days a week.  Maintain a healthy weight. The body mass index (BMI) is a screening tool to identify possible weight problems. It provides an estimate of body fat based on height and weight. Your caregiver can help determine your BMI, and can help you achieve or maintain a healthy weight.For adults 20 years and older:  A BMI below 18.5 is considered underweight.  A BMI of 18.5 to 24.9 is normal.  A BMI of 25 to 29.9 is considered overweight.  A BMI of 30 and above is  considered obese.  Maintain normal blood lipids and cholesterol levels by exercising and minimizing your intake of saturated fat. Eat a balanced diet with plenty of fruit and vegetables. Blood tests for lipids and cholesterol should begin at age 20 and be repeated every 5 years. If your lipid or cholesterol levels are high, you are over 50, or you are at high risk for heart disease, you may need your cholesterol levels checked more frequently.Ongoing high lipid and cholesterol levels should be treated with medicines if diet and exercise are not effective.  If you smoke, find out from your caregiver how to quit. If you do not use tobacco, do not start.  Lung cancer screening is recommended for adults aged 55 80 years who are at high risk for developing lung cancer because of a history of smoking. Yearly low-dose computed tomography (CT) is recommended for people who have at least a 30-pack-year history of smoking and are a current smoker or have quit within the past 15 years. A pack year of smoking is smoking an average of 1 pack of cigarettes a day for 1 year (for example: 1 pack a day for 30 years or 2 packs a day for 15 years). Yearly screening should continue until the smoker has stopped smoking for at least 15 years. Yearly screening should also be stopped for people who develop a health problem that would prevent them from having lung cancer treatment.  If you are pregnant, do not drink alcohol. If you are breastfeeding, be very cautious about drinking alcohol. If you are   not pregnant and choose to drink alcohol, do not exceed 1 drink per day. One drink is considered to be 12 ounces (355 mL) of beer, 5 ounces (148 mL) of wine, or 1.5 ounces (44 mL) of liquor.  Avoid use of street drugs. Do not share needles with anyone. Ask for help if you need support or instructions about stopping the use of drugs.  High blood pressure causes heart disease and increases the risk of stroke. Your blood pressure  should be checked at least every 1 to 2 years. Ongoing high blood pressure should be treated with medicines if weight loss and exercise are not effective.  If you are 55 to 60 years old, ask your caregiver if you should take aspirin to prevent strokes.  Diabetes screening involves taking a blood sample to check your fasting blood sugar level. This should be done once every 3 years, after age 45, if you are within normal weight and without risk factors for diabetes. Testing should be considered at a younger age or be carried out more frequently if you are overweight and have at least 1 risk factor for diabetes.  Breast cancer screening is essential preventive care for women. You should practice "breast self-awareness." This means understanding the normal appearance and feel of your breasts and may include breast self-examination. Any changes detected, no matter how small, should be reported to a caregiver. Women in their 20s and 30s should have a clinical breast exam (CBE) by a caregiver as part of a regular health exam every 1 to 3 years. After age 40, women should have a CBE every year. Starting at age 40, women should consider having a mammography (breast X-ray test) every year. Women who have a family history of breast cancer should talk to their caregiver about genetic screening. Women at a high risk of breast cancer should talk to their caregivers about having magnetic resonance imaging (MRI) and a mammography every year.  Breast cancer gene (BRCA)-related cancer risk assessment is recommended for women who have family members with BRCA-related cancers. BRCA-related cancers include breast, ovarian, tubal, and peritoneal cancers. Having family members with these cancers may be associated with an increased risk for harmful changes (mutations) in the breast cancer genes BRCA1 and BRCA2. Results of the assessment will determine the need for genetic counseling and BRCA1 and BRCA2 testing.  The Pap test is  a screening test for cervical cancer. A Pap test can show cell changes on the cervix that might become cervical cancer if left untreated. A Pap test is a procedure in which cells are obtained and examined from the lower end of the uterus (cervix).  Women should have a Pap test starting at age 21.  Between ages 21 and 29, Pap tests should be repeated every 2 years.  Beginning at age 30, you should have a Pap test every 3 years as long as the past 3 Pap tests have been normal.  Some women have medical problems that increase the chance of getting cervical cancer. Talk to your caregiver about these problems. It is especially important to talk to your caregiver if a new problem develops soon after your last Pap test. In these cases, your caregiver may recommend more frequent screening and Pap tests.  The above recommendations are the same for women who have or have not gotten the vaccine for human papillomavirus (HPV).  If you had a hysterectomy for a problem that was not cancer or a condition that could lead to cancer, then   you no longer need Pap tests. Even if you no longer need a Pap test, a regular exam is a good idea to make sure no other problems are starting.  If you are between ages 65 and 70, and you have had normal Pap tests going back 10 years, you no longer need Pap tests. Even if you no longer need a Pap test, a regular exam is a good idea to make sure no other problems are starting.  If you have had past treatment for cervical cancer or a condition that could lead to cancer, you need Pap tests and screening for cancer for at least 20 years after your treatment.  If Pap tests have been discontinued, risk factors (such as a new sexual partner) need to be reassessed to determine if screening should be resumed.  The HPV test is an additional test that may be used for cervical cancer screening. The HPV test looks for the virus that can cause the cell changes on the cervix. The cells collected  during the Pap test can be tested for HPV. The HPV test could be used to screen women aged 30 years and older, and should be used in women of any age who have unclear Pap test results. After the age of 30, women should have HPV testing at the same frequency as a Pap test.  Colorectal cancer can be detected and often prevented. Most routine colorectal cancer screening begins at the age of 50 and continues through age 75. However, your caregiver may recommend screening at an earlier age if you have risk factors for colon cancer. On a yearly basis, your caregiver may provide home test kits to check for hidden blood in the stool. Use of a small camera at the end of a tube, to directly examine the colon (sigmoidoscopy or colonoscopy), can detect the earliest forms of colorectal cancer. Talk to your caregiver about this at age 50, when routine screening begins. Direct examination of the colon should be repeated every 5 to 10 years through age 75, unless early forms of pre-cancerous polyps or small growths are found.  Hepatitis C blood testing is recommended for all people born from 1945 through 1965 and any individual with known risks for hepatitis C.  Practice safe sex. Use condoms and avoid high-risk sexual practices to reduce the spread of sexually transmitted infections (STIs). STIs include gonorrhea, chlamydia, syphilis, trichomonas, herpes, HPV, and human immunodeficiency virus (HIV). Herpes, HIV, and HPV are viral illnesses that have no cure. They can result in disability, cancer, and death. Sexually active women aged 25 and younger should be checked for chlamydia. Older women with new or multiple partners should also be tested for chlamydia. Testing for other STIs is recommended if you are sexually active and at increased risk.  Osteoporosis is a disease in which the bones lose minerals and strength with aging. This can result in serious bone fractures. The risk of osteoporosis can be identified using a  bone density scan. Women ages 65 and over and women at risk for fractures or osteoporosis should discuss screening with their caregivers. Ask your caregiver whether you should take a calcium supplement or vitamin D to reduce the rate of osteoporosis.  Menopause can be associated with physical symptoms and risks. Hormone replacement therapy is available to decrease symptoms and risks. You should talk to your caregiver about whether hormone replacement therapy is right for you.  Use sunscreen. Apply sunscreen liberally and repeatedly throughout the day. You should seek shade   when your shadow is shorter than you. Protect yourself by wearing long sleeves, pants, a wide-brimmed hat, and sunglasses year round, whenever you are outdoors.  Once a month, do a whole body skin exam, using a mirror to look at the skin on your back. Notify your caregiver of new moles, moles that have irregular borders, moles that are larger than a pencil eraser, or moles that have changed in shape or color.  Stay current with required immunizations.  Influenza vaccine. All adults should be immunized every year.  Tetanus, diphtheria, and acellular pertussis (Td, Tdap) vaccine. Pregnant women should receive 1 dose of Tdap vaccine during each pregnancy. The dose should be obtained regardless of the length of time since the last dose. Immunization is preferred during the 27th to 36th week of gestation. An adult who has not previously received Tdap or who does not know her vaccine status should receive 1 dose of Tdap. This initial dose should be followed by tetanus and diphtheria toxoids (Td) booster doses every 10 years. Adults with an unknown or incomplete history of completing a 3-dose immunization series with Td-containing vaccines should begin or complete a primary immunization series including a Tdap dose. Adults should receive a Td booster every 10 years.  Varicella vaccine. An adult without evidence of immunity to varicella  should receive 2 doses or a second dose if she has previously received 1 dose. Pregnant females who do not have evidence of immunity should receive the first dose after pregnancy. This first dose should be obtained before leaving the health care facility. The second dose should be obtained 4 8 weeks after the first dose.  Human papillomavirus (HPV) vaccine. Females aged 13 26 years who have not received the vaccine previously should obtain the 3-dose series. The vaccine is not recommended for use in pregnant females. However, pregnancy testing is not needed before receiving a dose. If a female is found to be pregnant after receiving a dose, no treatment is needed. In that case, the remaining doses should be delayed until after the pregnancy. Immunization is recommended for any person with an immunocompromised condition through the age of 26 years if she did not get any or all doses earlier. During the 3-dose series, the second dose should be obtained 4 8 weeks after the first dose. The third dose should be obtained 24 weeks after the first dose and 16 weeks after the second dose.  Zoster vaccine. One dose is recommended for adults aged 60 years or older unless certain conditions are present.  Measles, mumps, and rubella (MMR) vaccine. Adults born before 1957 generally are considered immune to measles and mumps. Adults born in 1957 or later should have 1 or more doses of MMR vaccine unless there is a contraindication to the vaccine or there is laboratory evidence of immunity to each of the three diseases. A routine second dose of MMR vaccine should be obtained at least 28 days after the first dose for students attending postsecondary schools, health care workers, or international travelers. People who received inactivated measles vaccine or an unknown type of measles vaccine during 1963 1967 should receive 2 doses of MMR vaccine. People who received inactivated mumps vaccine or an unknown type of mumps vaccine  before 1979 and are at high risk for mumps infection should consider immunization with 2 doses of MMR vaccine. For females of childbearing age, rubella immunity should be determined. If there is no evidence of immunity, females who are not pregnant should be vaccinated. If there   is no evidence of immunity, females who are pregnant should delay immunization until after pregnancy. Unvaccinated health care workers born before 1957 who lack laboratory evidence of measles, mumps, or rubella immunity or laboratory confirmation of disease should consider measles and mumps immunization with 2 doses of MMR vaccine or rubella immunization with 1 dose of MMR vaccine.  Pneumococcal 13-valent conjugate (PCV13) vaccine. When indicated, a person who is uncertain of her immunization history and has no record of immunization should receive the PCV13 vaccine. An adult aged 19 years or older who has certain medical conditions and has not been previously immunized should receive 1 dose of PCV13 vaccine. This PCV13 should be followed with a dose of pneumococcal polysaccharide (PPSV23) vaccine. The PPSV23 vaccine dose should be obtained at least 8 weeks after the dose of PCV13 vaccine. An adult aged 19 years or older who has certain medical conditions and previously received 1 or more doses of PPSV23 vaccine should receive 1 dose of PCV13. The PCV13 vaccine dose should be obtained 1 or more years after the last PPSV23 vaccine dose.  Pneumococcal polysaccharide (PPSV23) vaccine. When PCV13 is also indicated, PCV13 should be obtained first. All adults aged 65 years and older should be immunized. An adult younger than age 65 years who has certain medical conditions should be immunized. Any person who resides in a nursing home or long-term care facility should be immunized. An adult smoker should be immunized. People with an immunocompromised condition and certain other conditions should receive both PCV13 and PPSV23 vaccines. People  with human immunodeficiency virus (HIV) infection should be immunized as soon as possible after diagnosis. Immunization during chemotherapy or radiation therapy should be avoided. Routine use of PPSV23 vaccine is not recommended for American Indians, [place] Natives, or people younger than 65 years unless there are medical conditions that require PPSV23 vaccine. When indicated, people who have unknown immunization and have no record of immunization should receive PPSV23 vaccine. One-time revaccination 5 years after the first dose of PPSV23 is recommended for people aged 19 64 years who have chronic kidney failure, nephrotic syndrome, asplenia, or immunocompromised conditions. People who received 1 2 doses of PPSV23 before age 65 years should receive another dose of PPSV23 vaccine at age 65 years or later if at least 5 years have passed since the previous dose. Doses of PPSV23 are not needed for people immunized with PPSV23 at or after age 65 years.  Meningococcal vaccine. Adults with asplenia or persistent complement component deficiencies should receive 2 doses of quadrivalent meningococcal conjugate (MenACWY-D) vaccine. The doses should be obtained at least 2 months apart. Microbiologists working with certain meningococcal bacteria, military recruits, people at risk during an outbreak, and people who travel to or live in countries with a high rate of meningitis should be immunized. A first-year college student up through age 21 years who is living in a residence hall should receive a dose if she did not receive a dose on or after her 16th birthday. Adults who have certain high-risk conditions should receive one or more doses of vaccine.  Hepatitis A vaccine. Adults who wish to be protected from this disease, have certain high-risk conditions, work with hepatitis A-infected animals, work in hepatitis A research labs, or travel to or work in countries with a high rate of hepatitis A should be immunized. Adults  who were previously unvaccinated and who anticipate close contact with an international adoptee during the first 60 days after arrival in the [place] from a country   with a high rate of hepatitis A should be immunized.  Hepatitis B vaccine. Adults who wish to be protected from this disease, have certain high-risk conditions, may be exposed to blood or other infectious body fluids, are household contacts or sex partners of hepatitis B positive people, are clients or workers in certain care facilities, or travel to or work in countries with a high rate of hepatitis B should be immunized.  Haemophilus influenzae type b (Hib) vaccine. A previously unvaccinated person with asplenia or sickle cell disease or having a scheduled splenectomy should receive 1 dose of Hib vaccine. Regardless of previous immunization, a recipient of a hematopoietic stem cell transplant should receive a 3-dose series 6 12 months after her successful transplant. Hib vaccine is not recommended for adults with HIV infection. Preventive Services / Frequency Ages 19 to 39  Blood pressure check.** / Every 1 to 2 years.  Lipid and cholesterol check.** / Every 5 years beginning at age 20.  Clinical breast exam.** / Every 3 years for women in their 20s and 30s.  BRCA-related cancer risk assessment.** / For women who have family members with a BRCA-related cancer (breast, ovarian, tubal, or peritoneal cancers).  Pap test.** / Every 2 years from ages 21 through 29. Every 3 years starting at age 30 through age 65 or 70 with a history of 3 consecutive normal Pap tests.  HPV screening.** / Every 3 years from ages 30 through ages 65 to 70 with a history of 3 consecutive normal Pap tests.  Hepatitis C blood test.** / For any individual with known risks for hepatitis C.  Skin self-exam. / Monthly.  Influenza vaccine. / Every year.  Tetanus, diphtheria, and acellular pertussis (Tdap, Td) vaccine.** / Consult your caregiver. Pregnant  women should receive 1 dose of Tdap vaccine during each pregnancy. 1 dose of Td every 10 years.  Varicella vaccine.** / Consult your caregiver. Pregnant females who do not have evidence of immunity should receive the first dose after pregnancy.  HPV vaccine. / 3 doses over 6 months, if 26 and younger. The vaccine is not recommended for use in pregnant females. However, pregnancy testing is not needed before receiving a dose.  Measles, mumps, rubella (MMR) vaccine.** / You need at least 1 dose of MMR if you were born in 1957 or later. You may also need a 2nd dose. For females of childbearing age, rubella immunity should be determined. If there is no evidence of immunity, females who are not pregnant should be vaccinated. If there is no evidence of immunity, females who are pregnant should delay immunization until after pregnancy.  Pneumococcal 13-valent conjugate (PCV13) vaccine.** / Consult your caregiver.  Pneumococcal polysaccharide (PPSV23) vaccine.** / 1 to 2 doses if you smoke cigarettes or if you have certain conditions.  Meningococcal vaccine.** / 1 dose if you are age 19 to 21 years and a first-year college student living in a residence hall, or have one of several medical conditions, you need to get vaccinated against meningococcal disease. You may also need additional booster doses.  Hepatitis A vaccine.** / Consult your caregiver.  Hepatitis B vaccine.** / Consult your caregiver.  Haemophilus influenzae type b (Hib) vaccine.** / Consult your caregiver. Ages 40 to 64  Blood pressure check.** / Every 1 to 2 years.  Lipid and cholesterol check.** / Every 5 years beginning at age 20.  Lung cancer screening. / Every year if you are aged 55 80 years and have a 30-pack-year history of smoking and   currently smoke or have quit within the past 15 years. Yearly screening is stopped once you have quit smoking for at least 15 years or develop a health problem that would prevent you from having  lung cancer treatment.  Clinical breast exam.** / Every year after age 40.  BRCA-related cancer risk assessment.** / For women who have family members with a BRCA-related cancer (breast, ovarian, tubal, or peritoneal cancers).  Mammogram.** / Every year beginning at age 40 and continuing for as long as you are in good health. Consult with your caregiver.  Pap test.** / Every 3 years starting at age 30 through age 65 or 70 with a history of 3 consecutive normal Pap tests.  HPV screening.** / Every 3 years from ages 30 through ages 65 to 70 with a history of 3 consecutive normal Pap tests.  Fecal occult blood test (FOBT) of stool. / Every year beginning at age 50 and continuing until age 75. You may not need to do this test if you get a colonoscopy every 10 years.  Flexible sigmoidoscopy or colonoscopy.** / Every 5 years for a flexible sigmoidoscopy or every 10 years for a colonoscopy beginning at age 50 and continuing until age 75.  Hepatitis C blood test.** / For all people born from 1945 through 1965 and any individual with known risks for hepatitis C.  Skin self-exam. / Monthly.  Influenza vaccine. / Every year.  Tetanus, diphtheria, and acellular pertussis (Tdap/Td) vaccine.** / Consult your caregiver. Pregnant women should receive 1 dose of Tdap vaccine during each pregnancy. 1 dose of Td every 10 years.  Varicella vaccine.** / Consult your caregiver. Pregnant females who do not have evidence of immunity should receive the first dose after pregnancy.  Zoster vaccine.** / 1 dose for adults aged 60 years or older.  Measles, mumps, rubella (MMR) vaccine.** / You need at least 1 dose of MMR if you were born in 1957 or later. You may also need a 2nd dose. For females of childbearing age, rubella immunity should be determined. If there is no evidence of immunity, females who are not pregnant should be vaccinated. If there is no evidence of immunity, females who are pregnant should delay  immunization until after pregnancy.  Pneumococcal 13-valent conjugate (PCV13) vaccine.** / Consult your caregiver.  Pneumococcal polysaccharide (PPSV23) vaccine.** / 1 to 2 doses if you smoke cigarettes or if you have certain conditions.  Meningococcal vaccine.** / Consult your caregiver.  Hepatitis A vaccine.** / Consult your caregiver.  Hepatitis B vaccine.** / Consult your caregiver.  Haemophilus influenzae type b (Hib) vaccine.** / Consult your caregiver. Ages 65 and over  Blood pressure check.** / Every 1 to 2 years.  Lipid and cholesterol check.** / Every 5 years beginning at age 20.  Lung cancer screening. / Every year if you are aged 55 80 years and have a 30-pack-year history of smoking and currently smoke or have quit within the past 15 years. Yearly screening is stopped once you have quit smoking for at least 15 years or develop a health problem that would prevent you from having lung cancer treatment.  Clinical breast exam.** / Every year after age 40.  BRCA-related cancer risk assessment.** / For women who have family members with a BRCA-related cancer (breast, ovarian, tubal, or peritoneal cancers).  Mammogram.** / Every year beginning at age 40 and continuing for as long as you are in good health. Consult with your caregiver.  Pap test.** / Every 3 years starting at age   30 through age 65 or 70 with a 3 consecutive normal Pap tests. Testing can be stopped between 65 and 70 with 3 consecutive normal Pap tests and no abnormal Pap or HPV tests in the past 10 years.  HPV screening.** / Every 3 years from ages 30 through ages 65 or 70 with a history of 3 consecutive normal Pap tests. Testing can be stopped between 65 and 70 with 3 consecutive normal Pap tests and no abnormal Pap or HPV tests in the past 10 years.  Fecal occult blood test (FOBT) of stool. / Every year beginning at age 50 and continuing until age 75. You may not need to do this test if you get a colonoscopy  every 10 years.  Flexible sigmoidoscopy or colonoscopy.** / Every 5 years for a flexible sigmoidoscopy or every 10 years for a colonoscopy beginning at age 50 and continuing until age 75.  Hepatitis C blood test.** / For all people born from 1945 through 1965 and any individual with known risks for hepatitis C.  Osteoporosis screening.** / A one-time screening for women ages 65 and over and women at risk for fractures or osteoporosis.  Skin self-exam. / Monthly.  Influenza vaccine. / Every year.  Tetanus, diphtheria, and acellular pertussis (Tdap/Td) vaccine.** / 1 dose of Td every 10 years.  Varicella vaccine.** / Consult your caregiver.  Zoster vaccine.** / 1 dose for adults aged 60 years or older.  Pneumococcal 13-valent conjugate (PCV13) vaccine.** / Consult your caregiver.  Pneumococcal polysaccharide (PPSV23) vaccine.** / 1 dose for all adults aged 65 years and older.  Meningococcal vaccine.** / Consult your caregiver.  Hepatitis A vaccine.** / Consult your caregiver.  Hepatitis B vaccine.** / Consult your caregiver.  Haemophilus influenzae type b (Hib) vaccine.** / Consult your caregiver. ** Family history and personal history of risk and conditions may change your caregiver's recommendations. Document Released: 03/14/2001 Document Revised: 05/13/2012 Document Reviewed: 06/13/2010 ExitCare Patient Information 2014 [employer].  

## 2013-01-14 NOTE — Progress Notes (Signed)
Pre visit review using our clinic review tool, if applicable. No additional management support is needed unless otherwise documented below in the visit note. 

## 2013-01-14 NOTE — Assessment & Plan Note (Signed)
Exam done She decline a shingles vax Labs ordered Pt ed material was given

## 2013-01-16 ENCOUNTER — Encounter: Payer: Self-pay | Admitting: Internal Medicine

## 2013-01-17 ENCOUNTER — Encounter: Payer: Self-pay | Admitting: Internal Medicine

## 2013-02-07 ENCOUNTER — Encounter: Payer: Self-pay | Admitting: Internal Medicine

## 2013-02-07 ENCOUNTER — Ambulatory Visit (AMBULATORY_SURGERY_CENTER): Payer: 59 | Admitting: Internal Medicine

## 2013-02-07 VITALS — BP 98/48 | HR 61 | Temp 97.9°F | Resp 19 | Ht 66.5 in | Wt 158.0 lb

## 2013-02-07 DIAGNOSIS — Z800 Family history of malignant neoplasm of digestive organs: Secondary | ICD-10-CM

## 2013-02-07 DIAGNOSIS — Z1211 Encounter for screening for malignant neoplasm of colon: Secondary | ICD-10-CM

## 2013-02-07 DIAGNOSIS — D126 Benign neoplasm of colon, unspecified: Secondary | ICD-10-CM

## 2013-02-07 MED ORDER — SODIUM CHLORIDE 0.9 % IV SOLN: 500.0000 mL | INTRAVENOUS | Status: DC

## 2013-02-07 NOTE — Progress Notes (Signed)
Called to room to assist during endoscopic procedure.  Patient ID and intended procedure confirmed with present staff. Received instructions for my participation in the procedure from the performing physician.  

## 2013-02-07 NOTE — Patient Instructions (Signed)
Discharge instructions given with verbal understanding. Handouts on polyps and diverticulosis. Resume previous medications. YOU HAD AN ENDOSCOPIC PROCEDURE TODAY AT THE [facility]: Refer to the procedure report that was given to you for any specific questions about what was found during the examination.  If the procedure report does not answer your questions, please call your gastroenterologist to clarify.  If you requested that your care partner not be given the details of your procedure findings, then the procedure report has been included in a sealed envelope for you to review at your convenience later.  YOU SHOULD EXPECT: Some feelings of bloating in the abdomen. Passage of more gas than usual.  Walking can help get rid of the air that was put into your GI tract during the procedure and reduce the bloating. If you had a lower endoscopy (such as a colonoscopy or flexible sigmoidoscopy) you may notice spotting of blood in your stool or on the toilet paper. If you underwent a bowel prep for your procedure, then you may not have a normal bowel movement for a few days.  DIET: Your first meal following the procedure should be a light meal and then it is ok to progress to your normal diet.  A half-sandwich or bowl of soup is an example of a good first meal.  Heavy or fried foods are harder to digest and may make you feel nauseous or bloated.  Likewise meals heavy in dairy and vegetables can cause extra gas to form and this can also increase the bloating.  Drink plenty of fluids but you should avoid alcoholic beverages for 24 hours.  ACTIVITY: Your care partner should take you home directly after the procedure.  You should plan to take it easy, moving slowly for the rest of the day.  You can resume normal activity the day after the procedure however you should NOT DRIVE or use heavy machinery for 24 hours (because of the sedation medicines used during the test).    SYMPTOMS TO REPORT  IMMEDIATELY: A gastroenterologist can be reached at any hour.  During normal business hours, 8:30 AM to 5:00 PM Monday through Friday, call [phone].  After hours and on weekends, please call the GI answering service at [phone] who will take a message and have the physician on call contact you.   Following lower endoscopy (colonoscopy or flexible sigmoidoscopy):  Excessive amounts of blood in the stool  Significant tenderness or worsening of abdominal pains  Swelling of the abdomen that is new, acute  Fever of 100F or higher  FOLLOW UP: If any biopsies were taken you will be contacted by phone or by letter within the next 1-3 weeks.  Call your gastroenterologist if you have not heard about the biopsies in 3 weeks.  Our staff will call the home number listed on your records the next business day following your procedure to check on you and address any questions or concerns that you may have at that time regarding the information given to you following your procedure. This is a courtesy call and so if there is no answer at the home number and we have not heard from you through the emergency physician on call, we will assume that you have returned to your regular daily activities without incident.  SIGNATURES/CONFIDENTIALITY: You and/or your care partner have signed paperwork which will be entered into your electronic medical record.  These signatures attest to the fact that that the information above on your After Visit Summary   has been reviewed and is understood.  Full responsibility of the confidentiality of this discharge information lies with you and/or your care-partner. 

## 2013-02-07 NOTE — Progress Notes (Signed)
Report to pacu rn, vss, bbs=clear 

## 2013-02-07 NOTE — Progress Notes (Signed)
No egg or soy allergy. ewm Pt states she is hard to wake with some sedations and also that her BP will drop with sedation as well. No other issues with sedation. ewm

## 2013-02-07 NOTE — Op Note (Signed)
[facility]  [address] [place], [id number]   COLONOSCOPY PROCEDURE REPORT  PATIENT: Joanna Anthony, Maiden  MR#: [id number] BIRTHDATE: 29-Dec-1950 , 63  yrs. old GENDER: Female ENDOSCOPIST: Lafayette Dragon, MD REFERRED MO:QHUTML Evalina Field, M.D. PROCEDURE DATE:  02/07/2013 PROCEDURE:   Colonoscopy with cold biopsy polypectomy First Screening Colonoscopy - Avg.  risk and is 50 yrs.  old or older - No.  Prior Negative Screening - Now for repeat screening. Above average risk  History of Adenoma - Now for follow-up colonoscopy & has been > or = to 3 yrs.  N/A  Polyps Removed Today? Yes. ASA CLASS:   Class II INDICATIONS:Patient's immediate family history of colon cancer and mother with colon cancer at age 72.  Brother and sister with colon polyps.  Prior colonoscopy in 2002 and in 2009. MEDICATIONS: MAC sedation, administered by CRNA and Propofol (Diprivan) 270 mg IV  DESCRIPTION OF PROCEDURE:   After the risks benefits and alternatives of the procedure were thoroughly explained, informed consent was obtained.  A digital rectal exam revealed no abnormalities of the rectum.   The LB [id number]-[id number]  endoscope was introduced through the anus and advanced to the cecum, which was identified by both the appendix and ileocecal valve. No adverse events experienced.   The quality of the prep was good, using MoviPrep  The instrument was then slowly withdrawn as the colon was fully examined.      COLON FINDINGS: A polypoid shaped sessile polyp ranging between 3-52mm in size was found in the descending colon.  A polypectomy was performed with cold forceps.  The resection was complete and the polyp tissue was completely retrieved.   Mild diverticulosis was noted in the sigmoid colon.  Retroflexed views revealed no abnormalities. The time to cecum=9 minutes 23 seconds.  Withdrawal time=11 minutes 49 seconds.  The scope was withdrawn and the procedure completed. COMPLICATIONS:  There were no complications.  ENDOSCOPIC IMPRESSION: 1.   Sessile polyp ranging between 3-52mm in size was found in the descending colon; polypectomy was performed with cold forceps 2.   Mild diverticulosis was noted in the sigmoid colon  RECOMMENDATIONS: 1.  Await biopsy results 2.  high fiber diet Recall colonoscopy in 5 years   eSigned:  Lafayette Dragon, MD 02/07/2013 9:12 AM   cc:   PATIENT NAME:  Joanna Anthony MR#: [id number]

## 2013-02-10 ENCOUNTER — Telehealth: Payer: Self-pay

## 2013-02-10 NOTE — Telephone Encounter (Signed)
Left message on answering machine. 

## 2013-02-11 ENCOUNTER — Encounter: Payer: Self-pay | Admitting: Internal Medicine

## 2013-02-12 ENCOUNTER — Encounter: Payer: Self-pay | Admitting: *Deleted

## 2013-02-12 LAB — HM COLONOSCOPY

## 2013-08-08 ENCOUNTER — Other Ambulatory Visit: Payer: Self-pay | Admitting: Obstetrics and Gynecology

## 2013-08-11 LAB — CYTOLOGY - PAP

## 2013-08-12 LAB — HM MAMMOGRAPHY: HM Mammogram: NORMAL

## 2013-10-30 ENCOUNTER — Encounter: Payer: Self-pay | Admitting: Internal Medicine

## 2013-11-18 ENCOUNTER — Encounter: Payer: Self-pay | Admitting: Internal Medicine

## 2013-11-18 ENCOUNTER — Ambulatory Visit (INDEPENDENT_AMBULATORY_CARE_PROVIDER_SITE_OTHER): Payer: 59 | Admitting: Internal Medicine

## 2013-11-18 ENCOUNTER — Ambulatory Visit (INDEPENDENT_AMBULATORY_CARE_PROVIDER_SITE_OTHER)
Admission: RE | Admit: 2013-11-18 | Discharge: 2013-11-18 | Disposition: A | Discharge status: Home / Self Care | Payer: 59 | Source: Ambulatory Visit | Attending: Internal Medicine | Admitting: Internal Medicine

## 2013-11-18 VITALS — BP 146/90 | HR 99 | Temp 98.4°F | Resp 16 | Ht 66.5 in | Wt 165.2 lb

## 2013-11-18 DIAGNOSIS — R05 Cough: Secondary | ICD-10-CM

## 2013-11-18 DIAGNOSIS — R059 Cough, unspecified: Secondary | ICD-10-CM

## 2013-11-18 DIAGNOSIS — J202 Acute bronchitis due to streptococcus: Secondary | ICD-10-CM

## 2013-11-18 MED ORDER — HYDROCODONE-HOMATROPINE 5-1.5 MG/5ML PO SYRP: 5.0000 mL | ORAL_SOLUTION | Freq: Three times a day (TID) | ORAL | Status: DC | PRN

## 2013-11-18 MED ORDER — AZITHROMYCIN 500 MG PO TABS: 500.0000 mg | ORAL_TABLET | Freq: Every day | ORAL | Status: DC

## 2013-11-18 NOTE — Progress Notes (Signed)
   Subjective:    Patient ID: Joanna Anthony, female    DOB: 06/29/1950, 63 y.o.   MRN: [id number]  Cough This is a new problem. The current episode started in the past 7 days. The problem has been gradually worsening. The problem occurs every few hours. The cough is productive of purulent sputum. Associated symptoms include chills and a fever. Pertinent negatives include no chest pain, ear congestion, ear pain, headaches, heartburn, hemoptysis, myalgias, nasal congestion, postnasal drip, rash, rhinorrhea, sore throat, shortness of breath, sweats, weight loss or wheezing. She has tried nothing for the symptoms. The treatment provided no relief. There is no history of asthma, bronchiectasis, bronchitis, COPD, emphysema or pneumonia.      Review of Systems  Constitutional: Positive for fever and chills. Negative for weight loss, diaphoresis, activity change, appetite change, fatigue and unexpected weight change.  HENT: Negative.  Negative for ear pain, postnasal drip, rhinorrhea, sinus pressure, sore throat and trouble swallowing.   Eyes: Negative.   Respiratory: Positive for cough. Negative for apnea, hemoptysis, choking, chest tightness, shortness of breath and wheezing.   Cardiovascular: Negative.  Negative for chest pain, palpitations and leg swelling.  Gastrointestinal: Negative.  Negative for heartburn, nausea, vomiting, abdominal pain, diarrhea, constipation and blood in stool.  Endocrine: Negative.   Genitourinary: Negative.   Musculoskeletal: Negative.  Negative for arthralgias, myalgias and neck pain.  Skin: Negative.  Negative for rash.  Allergic/Immunologic: Negative.   Neurological: Negative.  Negative for headaches.  Hematological: Negative.  Negative for adenopathy. Does not bruise/bleed easily.  Psychiatric/Behavioral: Negative.        Objective:   Physical Exam  Vitals reviewed. Constitutional: She is oriented to person, place, and time. She appears well-developed and  well-nourished.  Non-toxic appearance. She does not have a sickly appearance. She does not appear ill. No distress.  HENT:  Head: Normocephalic and atraumatic.  Mouth/Throat: Oropharynx is clear and moist. No oropharyngeal exudate.  Eyes: Conjunctivae are normal. Right eye exhibits no discharge. Left eye exhibits no discharge. No scleral icterus.  Neck: Normal range of motion. Neck supple. No JVD present. No tracheal deviation present. No thyromegaly present.  Cardiovascular: Normal rate, regular rhythm, normal heart sounds and intact distal pulses.  Exam reveals no gallop and no friction rub.   No murmur heard. Pulmonary/Chest: Effort normal and breath sounds normal. No stridor. No respiratory distress. She has no wheezes. She has no rales. She exhibits no tenderness.  Abdominal: Soft. Bowel sounds are normal. She exhibits no distension and no mass. There is no tenderness. There is no rebound and no guarding.  Musculoskeletal: Normal range of motion. She exhibits no edema and no tenderness.  Lymphadenopathy:    She has no cervical adenopathy.  Neurological: She is oriented to person, place, and time.  Skin: Skin is warm and dry. No rash noted. She is not diaphoretic. No erythema. No pallor.          Assessment & Plan:

## 2013-11-18 NOTE — Assessment & Plan Note (Signed)
Will treat the infection with zithromax and will control the cough with hycodan 

## 2013-11-18 NOTE — Patient Instructions (Signed)
Cough, Adult  A cough is a reflex that helps clear your throat and airways. It can help heal the body or may be a reaction to an irritated airway. A cough may only last 2 or 3 weeks (acute) or may last more than 8 weeks (chronic).  CAUSES Acute cough:  Viral or bacterial infections. Chronic cough:  Infections.  Allergies.  Asthma.  Post-nasal drip.  Smoking.  Heartburn or acid reflux.  Some medicines.  Chronic lung problems (COPD).  Cancer. SYMPTOMS   Cough.  Fever.  Chest pain.  Increased breathing rate.  High-pitched whistling sound when breathing (wheezing).  Colored mucus that you cough up (sputum). TREATMENT   A bacterial cough may be treated with antibiotic medicine.  A viral cough must run its course and will not respond to antibiotics.  Your caregiver may recommend other treatments if you have a chronic cough. HOME CARE INSTRUCTIONS   Only take over-the-counter or prescription medicines for pain, discomfort, or fever as directed by your caregiver. Use cough suppressants only as directed by your caregiver.  Use a cold steam vaporizer or humidifier in your bedroom or home to help loosen secretions.  Sleep in a semi-upright position if your cough is worse at night.  Rest as needed.  Stop smoking if you smoke. SEEK IMMEDIATE MEDICAL CARE IF:   You have pus in your sputum.  Your cough starts to worsen.  You cannot control your cough with suppressants and are losing sleep.  You begin coughing up blood.  You have difficulty breathing.  You develop pain which is getting worse or is uncontrolled with medicine.  You have a fever. MAKE SURE YOU:   Understand these instructions.  Will watch your condition.  Will get help right away if you are not doing well or get worse. Document Released: 07/15/2010 Document Revised: 04/10/2011 Document Reviewed: 07/15/2010 ExitCare Patient Information 2015 [employer]. This information is not intended  to replace advice given to you by your health care provider. Make sure you discuss any questions you have with your health care provider.  

## 2013-11-18 NOTE — Progress Notes (Signed)
Pre visit review using our clinic review tool, if applicable. No additional management support is needed unless otherwise documented below in the visit note. 

## 2013-11-18 NOTE — Assessment & Plan Note (Signed)
Her CXR is normal.

## 2013-12-23 ENCOUNTER — Encounter: Payer: Self-pay | Admitting: Internal Medicine

## 2013-12-30 ENCOUNTER — Ambulatory Visit (INDEPENDENT_AMBULATORY_CARE_PROVIDER_SITE_OTHER): Payer: 59

## 2013-12-30 ENCOUNTER — Ambulatory Visit: Payer: Self-pay | Admitting: Internal Medicine

## 2013-12-30 DIAGNOSIS — Z23 Encounter for immunization: Secondary | ICD-10-CM

## 2014-10-22 LAB — HM MAMMOGRAPHY

## 2014-11-25 ENCOUNTER — Telehealth: Payer: Self-pay | Admitting: Internal Medicine

## 2014-11-25 NOTE — Telephone Encounter (Signed)
Patient left a message on mychart appointment request stating "I had my flu shot here at [facility] 10/27/14 , please update my file , thanks"

## 2014-11-25 NOTE — Telephone Encounter (Signed)
Thanks, chart updated.

## 2014-11-26 ENCOUNTER — Encounter: Payer: Self-pay | Admitting: Internal Medicine

## 2014-11-26 ENCOUNTER — Other Ambulatory Visit (INDEPENDENT_AMBULATORY_CARE_PROVIDER_SITE_OTHER): Payer: 59

## 2014-11-26 ENCOUNTER — Ambulatory Visit (INDEPENDENT_AMBULATORY_CARE_PROVIDER_SITE_OTHER): Payer: 59 | Admitting: Internal Medicine

## 2014-11-26 VITALS — BP 127/82 | HR 63 | Temp 98.2°F | Resp 16 | Ht 66.5 in | Wt 164.0 lb

## 2014-11-26 DIAGNOSIS — Z0000 Encounter for general adult medical examination without abnormal findings: Secondary | ICD-10-CM

## 2014-11-26 DIAGNOSIS — D510 Vitamin B12 deficiency anemia due to intrinsic factor deficiency: Secondary | ICD-10-CM

## 2014-11-26 DIAGNOSIS — R7309 Other abnormal glucose: Secondary | ICD-10-CM

## 2014-11-26 DIAGNOSIS — M7711 Lateral epicondylitis, right elbow: Secondary | ICD-10-CM | POA: Diagnosis not present

## 2014-11-26 LAB — LIPID PANEL
CHOLESTEROL: 205 mg/dL — AB (ref 0–200)
HDL: 66.3 mg/dL (ref >39.00)
LDL Cholesterol: 127 mg/dL — ABNORMAL HIGH (ref 0–99)
NonHDL: 139.03
Total CHOL/HDL Ratio: 3
Triglycerides: 58 mg/dL (ref 0.0–149.0)
VLDL: 11.6 mg/dL (ref 0.0–40.0)

## 2014-11-26 LAB — COMPREHENSIVE METABOLIC PANEL
ALBUMIN: 4.3 g/dL (ref 3.5–5.2)
ALT: 27 U/L (ref 0–35)
AST: 26 U/L (ref 0–37)
Alkaline Phosphatase: 58 U/L (ref 39–117)
BUN: 12 mg/dL (ref 6–23)
CO2: 26 mEq/L (ref 19–32)
Calcium: 9.5 mg/dL (ref 8.4–10.5)
Chloride: 106 mEq/L (ref 96–112)
Creatinine, Ser: 0.83 mg/dL (ref 0.40–1.20)
GFR: 73.87 mL/min (ref >60.00)
GLUCOSE: 96 mg/dL (ref 70–99)
POTASSIUM: 4 meq/L (ref 3.5–5.1)
Sodium: 141 mEq/L (ref 135–145)
TOTAL PROTEIN: 7.4 g/dL (ref 6.0–8.3)
Total Bilirubin: 0.6 mg/dL (ref 0.2–1.2)

## 2014-11-26 LAB — CBC WITH DIFFERENTIAL/PLATELET
BASOS ABS: 0 10*3/uL (ref 0.0–0.1)
Basophils Relative: 0.6 % (ref 0.0–3.0)
EOS ABS: 0.1 10*3/uL (ref 0.0–0.7)
Eosinophils Relative: 2.4 % (ref 0.0–5.0)
HCT: 42.9 % (ref 36.0–46.0)
HEMOGLOBIN: 14.4 g/dL (ref 12.0–15.0)
LYMPHS ABS: 1.7 10*3/uL (ref 0.7–4.0)
Lymphocytes Relative: 41 % (ref 12.0–46.0)
MCHC: 33.6 g/dL (ref 30.0–36.0)
MCV: 85.3 fl (ref 78.0–100.0)
MONO ABS: 0.3 10*3/uL (ref 0.1–1.0)
Monocytes Relative: 8.2 % (ref 3.0–12.0)
NEUTROS PCT: 47.8 % (ref 43.0–77.0)
Neutro Abs: 2 10*3/uL (ref 1.4–7.7)
Platelets: 212 10*3/uL (ref 150.0–400.0)
RBC: 5.03 Mil/uL (ref 3.87–5.11)
RDW: 12.8 % (ref 11.5–15.5)
WBC: 4.3 10*3/uL (ref 4.0–10.5)

## 2014-11-26 LAB — FERRITIN: Ferritin: 29.7 ng/mL (ref 10.0–291.0)

## 2014-11-26 LAB — IBC PANEL
Iron: 101 ug/dL (ref 42–145)
SATURATION RATIOS: 25.7 % (ref 20.0–50.0)
Transferrin: 281 mg/dL (ref 212.0–360.0)

## 2014-11-26 LAB — HEMOGLOBIN A1C: HEMOGLOBIN A1C: 5.4 % (ref 4.6–6.5)

## 2014-11-26 LAB — TSH: TSH: 0.61 u[IU]/mL (ref 0.35–4.50)

## 2014-11-26 LAB — FOLATE: Folate: 19.5 ng/mL (ref >5.9)

## 2014-11-26 LAB — VITAMIN B12: VITAMIN B 12: 577 pg/mL (ref 211–911)

## 2014-11-26 NOTE — Patient Instructions (Signed)
Tennis Elbow Tennis elbow (lateral epicondylitis) is inflammation of the outer tendons of your forearm close to your elbow. Your tendons attach your muscles to your bones. The outer tendons of your forearm are used to extend your wrist, and they attach on the outside part of your elbow. Tennis elbow is often found in people who play tennis, but anyone may get the condition from repeatedly extending the wrist or turning the forearm. CAUSES This condition is caused by repeatedly extending your wrist and using your hands. It can result from sports or work that requires repetitive forearm movements. Tennis elbow may also be caused by an injury. RISK FACTORS You have a higher risk of developing tennis elbow if you play tennis or another racquet sport. You also have a higher risk if you frequently use your hands for work. This condition is also more likely to develop in:  Musicians.  Carpenters, painters, and plumbers.  Cooks.  Cashiers.  People who work in factories.  Construction workers.  Butchers.  People who use computers. SYMPTOMS Symptoms of this condition include:  Pain and tenderness in your forearm and the outer part of your elbow. You may only feel the pain when you use your arm, or you may feel it even when you are not using your arm.  A burning feeling that runs from your elbow through your arm.  Weak grip in your hands. DIAGNOSIS  This condition may be diagnosed by medical history and physical exam. You may also have other tests, including:  X-rays.  MRI. TREATMENT Your health care provider will recommend lifestyle adjustments, such as resting and icing your arm. Treatment may also include:  Medicines for inflammation. This may include shots of cortisone if your pain continues.  Physical therapy. This may include massage or exercises.  An elbow brace. Surgery may eventually be recommended if your pain does not go away with treatment. HOME CARE  INSTRUCTIONS Activity  Rest your elbow and wrist as directed by your health care provider. Try to avoid any activities that caused the problem until your health care provider says that you can do them again.  If a physical therapist teaches you exercises, do all of them as directed.  If you lift an object, lift it with your palm facing upward. This lowers the stress on your elbow. Lifestyle  If your tennis elbow is caused by sports, check your equipment and make sure that:  You are using it correctly.  It is the best fit for you.  If your tennis elbow is caused by work, take breaks frequently, if you are able. Talk with your manager about how to best perform tasks in a way that is safe.  If your tennis elbow is caused by computer use, talk with your manager about any changes that can be made to your work environment. General Instructions  If directed, apply ice to the painful area:  Put ice in a plastic bag.  Place a towel between your skin and the bag.  Leave the ice on for 20 minutes, 2-3 times per day.  Take medicines only as directed by your health care provider.  If you were given a brace, wear it as directed by your health care provider.  Keep all follow-up visits as directed by your health care provider. This is important. SEEK MEDICAL CARE IF:  Your pain does not get better with treatment.  Your pain gets worse.  You have numbness or weakness in your forearm, hand, or fingers.     This information is not intended to replace advice given to you by your health care provider. Make sure you discuss any questions you have with your health care provider.   Document Released: 01/16/2005 Document Revised: 06/02/2014 Document Reviewed: 01/12/2014 Elsevier Interactive Patient Education 2016 [employer]  

## 2014-11-26 NOTE — Progress Notes (Signed)
Pre visit review using our clinic review tool, if applicable. No additional management support is needed unless otherwise documented below in the visit note. 

## 2014-11-26 NOTE — Progress Notes (Signed)
Subjective:  Patient ID: Joanna Anthony, female    DOB: 1950-10-21  Age: 64 y.o. MRN: [id number]  CC: Annual Exam; Elbow Pain; and Anemia   HPI JILLEEN ESSNER presents for a CPX but she also complains of right elbow pain for 2 months with no hx of trauma or injury. She notices the pain with twisting and bending in the elbow. She takes motrin and it provides pain relief.   She is applying for a gun permit and requests that I write a letter for her about her health and any hx of substance abuse.  Outpatient Prescriptions Prior to Visit  Medication Sig Dispense Refill  . ibuprofen (ADVIL,MOTRIN) 200 MG tablet Take 200 mg by mouth as needed.    Marland Kitchen acetaminophen (TYLENOL) 325 MG tablet Take 650 mg by mouth as needed for pain.    Marland Kitchen azithromycin (ZITHROMAX) 500 MG tablet Take 1 tablet (500 mg total) by mouth daily. 3 tablet 0  . HYDROcodone-homatropine (HYCODAN) 5-1.5 MG/5ML syrup Take 5 mLs by mouth every 8 (eight) hours as needed for cough. 120 mL 0  . Multiple Vitamins-Minerals (WOMENS MULTI PO) Take 1 tablet by mouth daily.     No facility-administered medications prior to visit.    ROS Review of Systems  Constitutional: Negative.  Negative for fever, chills, diaphoresis, activity change, appetite change, fatigue and unexpected weight change.  HENT: Negative.  Negative for sore throat and trouble swallowing.   Eyes: Negative.   Respiratory: Negative.  Negative for cough, choking, chest tightness, shortness of breath and stridor.   Cardiovascular: Negative.  Negative for chest pain, palpitations and leg swelling.  Gastrointestinal: Negative.  Negative for nausea, vomiting, abdominal pain, diarrhea, constipation and blood in stool.  Endocrine: Negative.   Genitourinary: Negative.  Negative for dysuria, urgency, frequency, hematuria, decreased urine volume, enuresis, difficulty urinating and pelvic pain.  Musculoskeletal: Positive for arthralgias. Negative for myalgias, back pain,  joint swelling and neck stiffness.  Skin: Negative.  Negative for rash.  Allergic/Immunologic: Negative.   Neurological: Negative.   Hematological: Negative.  Negative for adenopathy. Does not bruise/bleed easily.  Psychiatric/Behavioral: Negative.     Objective:  BP 127/82 mmHg  Pulse 63  Temp(Src) 98.2 F (36.8 C) (Oral)  Resp 16  Ht 5' 6.5" (1.689 m)  Wt 164 lb (74.39 kg)  BMI 26.08 kg/m2  SpO2 97%  BP Readings from Last 3 Encounters:  11/26/14 127/82  11/18/13 146/90  02/07/13 98/48    Wt Readings from Last 3 Encounters:  11/26/14 164 lb (74.39 kg)  11/18/13 165 lb 4 oz (74.957 kg)  02/07/13 158 lb (71.668 kg)    Physical Exam  Constitutional: She is oriented to person, place, and time. She appears well-developed and well-nourished. No distress.  HENT:  Head: Normocephalic and atraumatic.  Mouth/Throat: Oropharynx is clear and moist. No oropharyngeal exudate.  Eyes: Conjunctivae are normal. Right eye exhibits no discharge. Left eye exhibits no discharge. No scleral icterus.  Neck: Normal range of motion. Neck supple. No JVD present. No tracheal deviation present. No thyromegaly present.  Cardiovascular: Normal rate, regular rhythm, normal heart sounds and intact distal pulses.  Exam reveals no gallop and no friction rub.   No murmur heard. Pulmonary/Chest: Effort normal and breath sounds normal. No stridor. No respiratory distress. She has no wheezes. She has no rales. She exhibits no tenderness.  Abdominal: Soft. Bowel sounds are normal. She exhibits no distension and no mass. There is no tenderness. There is no rebound and  no guarding.  Musculoskeletal: Normal range of motion. She exhibits no edema or tenderness.       Right elbow: She exhibits normal range of motion, no swelling, no effusion, no deformity and no laceration. No radial head, no medial epicondyle and no olecranon process tenderness noted.  Lymphadenopathy:    She has no cervical adenopathy.    Neurological: She is oriented to person, place, and time.  Skin: Skin is warm and dry. No rash noted. She is not diaphoretic. No erythema. No pallor.  Psychiatric: She has a normal mood and affect. Her behavior is normal. Judgment and thought content normal.  Vitals reviewed.   Lab Results  Component Value Date   WBC 4.3 11/26/2014   HGB 14.4 11/26/2014   HCT 42.9 11/26/2014   PLT 212.0 11/26/2014   GLUCOSE 96 11/26/2014   CHOL 205* 11/26/2014   TRIG 58.0 11/26/2014   HDL 66.30 11/26/2014   LDLDIRECT 135.7 01/12/2012   LDLCALC 127* 11/26/2014   ALT 27 11/26/2014   AST 26 11/26/2014   NA 141 11/26/2014   K 4.0 11/26/2014   CL 106 11/26/2014   CREATININE 0.83 11/26/2014   BUN 12 11/26/2014   CO2 26 11/26/2014   TSH 0.61 11/26/2014   HGBA1C 5.4 11/26/2014    Dg Chest 2 View  11/18/2013  CLINICAL DATA:  Cough and congestion for 10 days, shortness of breath, fever EXAM: CHEST  2 VIEW COMPARISON:  04/20/2007 FINDINGS: Normal heart size, mediastinal contours, and pulmonary vascularity. Lungs clear. No pleural effusion or pneumothorax. Bones unremarkable. IMPRESSION: No acute abnormalities. Electronically Signed   By: Lavonia Dana M.D.   On: 11/18/2013 11:14    Assessment & Plan:   Vanisha was seen today for annual exam, elbow pain and anemia.  Diagnoses and all orders for this visit:  Other abnormal glucose- she has a hx of prediabetes, improvement noted today in her A1C -     Hemoglobin A1c; Future  Routine general medical examination at a health care facility- PAP and mammo are UTD, exam done, labs reviewed, vaccines were reviewed and updated, pt ed material was given -     Lipid panel; Future -     Comprehensive metabolic panel; Future -     TSH; Future -     CBC with Differential/Platelet; Future  Pernicious anemia- this has resolved and her vit levels are normal -     CBC with Differential/Platelet; Future -     Folate; Future -     Ferritin; Future -     IBC  panel; Future -     Vitamin B12; Future  Tennis elbow syndrome, right- she will start modalities at home, will cont motrin, if she does not improve soon then I will refer to OT for evaluation and treatment   I have discontinued Ms. Mizell's acetaminophen, Multiple Vitamins-Minerals (WOMENS MULTI PO), HYDROcodone-homatropine, and azithromycin. I am also having her maintain her ibuprofen.  No orders of the defined types were placed in this encounter.     Follow-up: Return in about 3 months (around 02/26/2015).  Scarlette Calico, MD

## 2014-11-30 DIAGNOSIS — M7710 Lateral epicondylitis, unspecified elbow: Secondary | ICD-10-CM | POA: Insufficient documentation

## 2014-12-01 ENCOUNTER — Other Ambulatory Visit: Payer: Self-pay | Admitting: Internal Medicine

## 2014-12-01 DIAGNOSIS — Z0000 Encounter for general adult medical examination without abnormal findings: Secondary | ICD-10-CM

## 2014-12-03 ENCOUNTER — Telehealth: Payer: Self-pay

## 2014-12-03 NOTE — Telephone Encounter (Signed)
Pt informed of negative tox screen report. No further info required

## 2014-12-08 ENCOUNTER — Encounter: Payer: Self-pay | Admitting: Internal Medicine

## 2014-12-08 ENCOUNTER — Emergency Department (HOSPITAL_COMMUNITY)
Admission: EM | Admit: 2014-12-08 | Discharge: 2014-12-08 | Disposition: A | Discharge status: Home / Self Care | Payer: 59 | Attending: Emergency Medicine | Admitting: Emergency Medicine

## 2014-12-08 ENCOUNTER — Encounter (HOSPITAL_COMMUNITY): Payer: Self-pay | Admitting: Emergency Medicine

## 2014-12-08 ENCOUNTER — Other Ambulatory Visit: Payer: Self-pay | Admitting: Internal Medicine

## 2014-12-08 ENCOUNTER — Ambulatory Visit (HOSPITAL_COMMUNITY)
Admission: RE | Admit: 2014-12-08 | Discharge: 2014-12-08 | Disposition: A | Discharge status: Home / Self Care | Payer: 59 | Source: Ambulatory Visit | Attending: Internal Medicine | Admitting: Internal Medicine

## 2014-12-08 DIAGNOSIS — Y92481 Parking lot as the place of occurrence of the external cause: Secondary | ICD-10-CM | POA: Diagnosis not present

## 2014-12-08 DIAGNOSIS — W1841XA Slipping, tripping and stumbling without falling due to stepping on object, initial encounter: Secondary | ICD-10-CM | POA: Diagnosis not present

## 2014-12-08 DIAGNOSIS — Z85828 Personal history of other malignant neoplasm of skin: Secondary | ICD-10-CM | POA: Insufficient documentation

## 2014-12-08 DIAGNOSIS — Y998 Other external cause status: Secondary | ICD-10-CM | POA: Insufficient documentation

## 2014-12-08 DIAGNOSIS — Y9301 Activity, walking, marching and hiking: Secondary | ICD-10-CM | POA: Insufficient documentation

## 2014-12-08 DIAGNOSIS — Z8679 Personal history of other diseases of the circulatory system: Secondary | ICD-10-CM | POA: Insufficient documentation

## 2014-12-08 DIAGNOSIS — S8261XA Displaced fracture of lateral malleolus of right fibula, initial encounter for closed fracture: Secondary | ICD-10-CM | POA: Insufficient documentation

## 2014-12-08 DIAGNOSIS — S99911A Unspecified injury of right ankle, initial encounter: Secondary | ICD-10-CM

## 2014-12-08 MED ORDER — OXYCODONE-ACETAMINOPHEN 5-325 MG PO TABS
2.0000 | ORAL_TABLET | Freq: Once | ORAL | Status: AC
  Administered 2014-12-08: 1 via ORAL
  Filled 2014-12-08: qty 2

## 2014-12-08 MED ORDER — OXYCODONE-ACETAMINOPHEN 5-325 MG PO TABS: 1.0000 | ORAL_TABLET | Freq: Four times a day (QID) | ORAL | Status: DC | PRN

## 2014-12-08 NOTE — ED Notes (Signed)
Pt reports that she was walking on uneven ground, this morning, when she twisted her ankle.

## 2014-12-08 NOTE — ED Provider Notes (Signed)
CSN: [id number]     Arrival date & time 12/08/14  1553 History   First MD Initiated Contact with Patient 12/08/14 1659     Chief Complaint  Patient presents with  . Ankle Injury   HPI  Joanna Anthony is a 64 year old female presenting with ankle injury. She reports she was walking through a parking lot when she slipped off the curb and rolled her right ankle. She reports that she heard a loud crack. She had immediate pain and swelling to the right lateral ankle. She was still able to bear weight and continued to walk on it throughout the day. She called her family medicine doctor who scheduled her an x-ray. X-ray was positive for lateral malleolar fracture. She has already been in contact with her orthopedic doctor, Dr. Ronnie Derby, who recommends a temporary short leg splint in ED and outpatient follow up. She is scheduled to go to his office next Tuesday for a hard cast. She denies numbness or tingling in her right foot. She denies wounds over her foot. No other complaints at this time.  Past Medical History  Diagnosis Date  . Allergy   . Migraine headache   . Skin cancer     hx of BCC/ neck and right arm,shoulder   Past Surgical History  Procedure Laterality Date  . Abdominal hysterectomy    . Tonsillectomy    . Oophorectomy    . Tubal ligation     Family History  Problem Relation Age of Onset  . Kidney disease Other   . Hypertension Other   . Colon cancer Other     Colon Cancer 1st degree relative<60  . Heart disease Neg Hx   . Early death Neg Hx   . Diabetes Neg Hx   . Alcohol abuse Neg Hx   . Hyperlipidemia Neg Hx   . Stroke Neg Hx   . Colon cancer Mother   . Kidney disease Father   . Bladder Cancer Father   . Hypertension Sister    Social History  Substance Use Topics  . Smoking status: Never Smoker   . Smokeless tobacco: Never Used  . Alcohol Use: No   OB History    No data available     Review of Systems  Musculoskeletal: Positive for joint swelling and  arthralgias.  Skin: Negative for wound.  All other systems reviewed and are negative.     Allergies  Meperidine hcl; Morphine; and Tetracycline  Home Medications   Prior to Admission medications   Medication Sig Start Date End Date Taking? Authorizing Provider  ibuprofen (ADVIL,MOTRIN) 200 MG tablet Take 600 mg by mouth every 4 (four) hours as needed for moderate pain.    Yes Historical Provider, MD  naproxen sodium (ANAPROX) 220 MG tablet Take 440 mg by mouth 2 (two) times daily as needed (pain.).   Yes Historical Provider, MD  oxyCODONE-acetaminophen (PERCOCET/ROXICET) 5-325 MG tablet Take 1-2 tablets by mouth every 6 (six) hours as needed for severe pain. 12/08/14   Jakaria Lavergne, PA-C   BP 135/79 mmHg  Pulse 77  Temp(Src) 97.9 F (36.6 C) (Oral)  Resp 18  SpO2 98% Physical Exam  Constitutional: She appears well-developed and well-nourished. No distress.  HENT:  Head: Normocephalic and atraumatic.  Eyes: Conjunctivae are normal. Right eye exhibits no discharge. Left eye exhibits no discharge. No scleral icterus.  Neck: Normal range of motion.  Cardiovascular: Normal rate, regular rhythm and intact distal pulses.   Pedal pulse palpable.  Pulmonary/Chest:  Effort normal. No respiratory distress.  Musculoskeletal: Normal range of motion. She exhibits edema and tenderness.  Right ankle with lateral edema. Faint ecchymosis noted over ankle. Ankle is moderately tender to palpation. no tenderness over forefoot, tib/fib or knee. She is able to plantar and dorsiflex her ankle though limited by pain. Full range of motion of toes and knee intact.  Neurological: She is alert. Coordination normal.  Strength of right ankle 4 out of 5, inhibited by pain. Sensation to light touch intact over foot.  Skin: Skin is warm and dry.  No wounds noted over ankle. Faint ecchymosis.  Psychiatric: She has a normal mood and affect. Her behavior is normal.  Nursing note and vitals reviewed.   ED Course   Procedures (including critical care time) Labs Review Labs Reviewed - No data to display  Imaging Review Dg Ankle Complete Right  12/08/2014  CLINICAL DATA:  Pt was walking at [facility] dept on side walk at 7:30 am today when she rolled her right ankle. Right ankle popped and cracked. Pain and swelling on lateral side. EXAM: RIGHT ANKLE - COMPLETE 3+ VIEW COMPARISON:  None. FINDINGS: There is significant lateral soft tissue swelling. Multiple bone fragments are identified adjacent to the lateral malleolus consistent with avulsion type injury. Mortise is intact. There is joint effusion. IMPRESSION: Lateral malleolar fracture.  Associated soft tissue swelling. Electronically Signed   By: Nolon Nations M.D.   On: 12/08/2014 14:32   I have personally reviewed and evaluated these images and lab results as part of my medical decision-making.   EKG Interpretation None      MDM   Final diagnoses:  Lateral malleolar fracture, right, closed, initial encounter   Patient presenting after a right ankle injury. She slipped off a curb with immediate pain and edema. She has already had an x-ray done by her primary care doctor earlier today. X-ray shows lateral malleolar fracture. She has already been in contact with her orthopedic doctor, Dr. Ronnie Derby. He recommended she come to ED for temporary soft splint and she will follow up outpatient. She has a follow-up appointment with him and his office in one week. Right foot is neurovascularly intact. We'll put in a cam walker boot. Patient will be discharged with pain medication and crutches. Return precautions given and discharge work and discussed with patient at bedside. Patient is stable for discharge with appropriate outpatient follow-up.    Josephina Gip, PA-C 12/09/14 [place], MD 12/11/14 [phone]

## 2014-12-08 NOTE — Telephone Encounter (Signed)
Patient has called back in regards.  Can call patient at [phone]

## 2014-12-08 NOTE — ED Notes (Signed)
Verbalized understanding discharge instructions and follow-up. In no acute distress.   

## 2014-12-08 NOTE — Discharge Instructions (Signed)
- Follow up with your orthopedic doctor at your scheduled visit next week   Ankle Fracture A fracture is a break in a bone. The ankle joint is made up of three bones. These include the lower (distal)sections of your lower leg bones, called the tibia and fibula, along with a bone in your foot, called the talus. Depending on how bad the break is and if more than one ankle joint bone is broken, a cast or splint is used to protect and keep your injured bone from moving while it heals. Sometimes, surgery is required to help the fracture heal properly.  There are two general types of fractures:  Stable fracture. This includes a single fracture line through one bone, with no injury to ankle ligaments. A fracture of the talus that does not have any displacement (movement of the bone on either side of the fracture line) is also stable.  Unstable fracture. This includes more than one fracture line through one or more bones in the ankle joint. It also includes fractures that have displacement of the bone on either side of the fracture line. CAUSES  A direct blow to the ankle.   Quickly and severely twisting your ankle.  Trauma, such as a car accident or falling from a significant height. RISK FACTORS You may be at a higher risk of ankle fracture if:  You have certain medical conditions.  You are involved in high-impact sports.  You are involved in a high-impact car accident. SIGNS AND SYMPTOMS   Tender and swollen ankle.  Bruising around the injured ankle.  Pain on movement of the ankle.  Difficulty walking or putting weight on the ankle.  A cold foot below the site of the ankle injury. This can occur if the blood vessels passing through your injured ankle were also damaged.  Numbness in the foot below the site of the ankle injury. DIAGNOSIS  An ankle fracture is usually diagnosed with a physical exam and X-rays. A CT scan may also be required for complex fractures. TREATMENT  Stable  fractures are treated with a cast or splint and using crutches to avoid putting weight on your injured ankle. This is followed by an ankle strengthening program. Some patients require a special type of cast, depending on other medical problems they may have. Unstable fractures require surgery to ensure the bones heal properly. Your health care provider will tell you what type of fracture you have and the best treatment for your condition. HOME CARE INSTRUCTIONS   Review correct crutch use with your health care provider and use your crutches as directed. Safe use of crutches is extremely important. Misuse of crutches can cause you to fall or cause injury to nerves in your hands or armpits.  Do not put weight or pressure on the injured ankle until directed by your health care provider.  To lessen the swelling, keep the injured leg elevated while sitting or lying down.  Apply ice to the injured area:  Put ice in a plastic bag.  Place a towel between your cast and the bag.  Leave the ice on for 20 minutes, 2-3 times a day.  If you have a plaster or fiberglass cast:  Do not try to scratch the skin under the cast with any objects. This can increase your risk of skin infection.  Check the skin around the cast every day. You may put lotion on any red or sore areas.  Keep your cast dry and clean.  If you have  a plaster splint:  Wear the splint as directed.  You may loosen the elastic around the splint if your toes become numb, tingle, or turn cold or blue.  Do not put pressure on any part of your cast or splint; it may break. Rest your cast only on a pillow the first 24 hours until it is fully hardened.  Your cast or splint can be protected during bathing with a plastic bag sealed to your skin with medical tape. Do not lower the cast or splint into water.  Take medicines as directed by your health care provider. Only take over-the-counter or prescription medicines for pain, discomfort, or  fever as directed by your health care provider.  Do not drive a vehicle until your health care provider specifically tells you it is safe to do so.  If your health care provider has given you a follow-up appointment, it is very important to keep that appointment. Not keeping the appointment could result in a chronic or permanent injury, pain, and disability. If you have any problem keeping the appointment, call the facility for assistance. SEEK MEDICAL CARE IF: You develop increased swelling or discomfort. SEEK IMMEDIATE MEDICAL CARE IF:   Your cast gets damaged or breaks.  You have continued severe pain.  You develop new pain or swelling after the cast was put on.  Your skin or toenails below the injury turn blue or gray.  Your skin or toenails below the injury feel cold, numb, or have loss of sensitivity to touch.  There is a bad smell or pus draining from under the cast. MAKE SURE YOU:   Understand these instructions.  Will watch your condition.  Will get help right away if you are not doing well or get worse.   This information is not intended to replace advice given to you by your health care provider. Make sure you discuss any questions you have with your health care provider.   Document Released: 01/14/2000 Document Revised: 01/21/2013 Document Reviewed: 08/15/2012 Elsevier Interactive Patient Education [employer].

## 2014-12-08 NOTE — ED Notes (Signed)
Pt states that she broke ankle this morning, has been walking on it all day. Pt had xray done around 1400 today.

## 2014-12-15 ENCOUNTER — Encounter: Payer: Self-pay | Admitting: Internal Medicine

## 2014-12-29 DIAGNOSIS — M25571 Pain in right ankle and joints of right foot: Secondary | ICD-10-CM | POA: Insufficient documentation

## 2015-05-07 DIAGNOSIS — H524 Presbyopia: Secondary | ICD-10-CM | POA: Diagnosis not present

## 2015-09-16 DIAGNOSIS — Z6827 Body mass index (BMI) 27.0-27.9, adult: Secondary | ICD-10-CM | POA: Diagnosis not present

## 2015-09-16 DIAGNOSIS — Z01419 Encounter for gynecological examination (general) (routine) without abnormal findings: Secondary | ICD-10-CM | POA: Diagnosis not present

## 2015-09-16 DIAGNOSIS — Z1231 Encounter for screening mammogram for malignant neoplasm of breast: Secondary | ICD-10-CM | POA: Diagnosis not present

## 2015-09-16 LAB — HM MAMMOGRAPHY: HM MAMMO: NORMAL (ref 0–4)

## 2015-10-21 ENCOUNTER — Encounter: Payer: Self-pay | Admitting: Internal Medicine

## 2016-01-25 ENCOUNTER — Telehealth: Payer: Self-pay | Admitting: Internal Medicine

## 2016-01-25 ENCOUNTER — Encounter: Payer: Self-pay | Admitting: Internal Medicine

## 2016-01-25 ENCOUNTER — Ambulatory Visit (INDEPENDENT_AMBULATORY_CARE_PROVIDER_SITE_OTHER): Payer: 59 | Admitting: Internal Medicine

## 2016-01-25 DIAGNOSIS — J209 Acute bronchitis, unspecified: Secondary | ICD-10-CM

## 2016-01-25 MED ORDER — PREDNISONE 20 MG PO TABS: 40.0000 mg | ORAL_TABLET | Freq: Every day | ORAL | 0 refills | Status: DC

## 2016-01-25 MED ORDER — DOXYCYCLINE HYCLATE 100 MG PO TABS: 100.0000 mg | ORAL_TABLET | Freq: Two times a day (BID) | ORAL | 0 refills | Status: DC

## 2016-01-25 MED FILL — predniSONE 20 MG TABS: 20 | 5 days supply | Qty: 10 | Fill #0

## 2016-01-25 MED FILL — DOXYCYCLINE HYCLATE 100 MG: 100 | 7 days supply | Qty: 14 | Fill #0

## 2016-01-25 NOTE — Assessment & Plan Note (Signed)
Rx for doxycycline, prednisone (noted rash to tetracyclines, not severe confirmed with patient). Work note given.

## 2016-01-25 NOTE — Patient Instructions (Addendum)
We have sent in doxycycline for the lungs. Take 1 pill twice a day for the next week.   We have also sent in prednisone. Take 2 pills a day for 5 days then stop.  Consider taking some zyrtec (they sell the generic at the [facility]) to help dry up the sinuses as well for the next week or two.   The coughing may continue for several weeks even after the infection has cleared.

## 2016-01-25 NOTE — Progress Notes (Signed)
   Subjective:    Patient ID: Joanna Anthony, female    DOB: 07/10/1950, 65 y.o.   MRN: [id number]  HPI The patient is a 65 YO female coming in for fevers and cough and wheezing for about 6-7 days. She has been running fevers and chills during that time. Cough which is somewhat productive. Also having sinus congestion and drainage. Some mild SOB. Not able to work with this. Taking tylenol and ibuprofen for the fevers but nothing else for the symptoms. Overall they are stable or mildly worsening.   Review of Systems  Constitutional: Positive for activity change, chills and fever. Negative for appetite change and unexpected weight change.  HENT: Positive for congestion, rhinorrhea and sinus pressure. Negative for ear discharge, ear pain, postnasal drip, sinus pain, sore throat and trouble swallowing.   Eyes: Negative.   Respiratory: Positive for cough and shortness of breath. Negative for chest tightness and wheezing.   Cardiovascular: Negative.   Gastrointestinal: Negative.   Musculoskeletal: Positive for myalgias.  Skin: Negative.   Neurological: Negative.       Objective:   Physical Exam  Constitutional: She is oriented to person, place, and time. She appears well-developed and well-nourished.  HENT:  Head: Normocephalic and atraumatic.  TMs normal, oropharynx with erythema, nose without crusting and no sinus pressure.   Eyes: EOM are normal.  Neck: Normal range of motion. No JVD present.  Cardiovascular: Normal rate and regular rhythm.   Pulmonary/Chest: Effort normal. No respiratory distress. She has wheezes. She has no rales. She exhibits no tenderness.  Some mild rhonchi and crackles in the lungs bilaterally which clear partially with cough.   Abdominal: Soft.  Lymphadenopathy:    She has no cervical adenopathy.  Neurological: She is alert and oriented to person, place, and time.  Skin: Skin is warm and dry.   Vitals:   01/25/16 0958  BP: (!) 150/70  Pulse: 90  Temp:  98.5 F (36.9 C)  TempSrc: Oral  SpO2: 98%  Weight: 170 lb (77.1 kg)  Height: 5' 6.5" (1.689 m)      Assessment & Plan:

## 2016-01-25 NOTE — Progress Notes (Signed)
Pre visit review using our clinic review tool, if applicable. No additional management support is needed unless otherwise documented below in the visit note. 

## 2016-01-25 NOTE — Telephone Encounter (Signed)
Patient Name: AFRICA TALK DOB: May 26, 1950 Initial Comment Caller States she has a fever, weakness, coughing, shortness of breath, headache, would like to be seen today Nurse Assessment Nurse: Ronnald Ramp, RN, Miranda Date/Time (Eastern Time): 01/25/2016 7:49:27 AM Confirm and document reason for call. If symptomatic, describe symptoms. ---Caller states she started having a cough on Wednesday. She started running a fever and congestion on Friday. Temp 100.3. Does the patient have any new or worsening symptoms? ---Yes Will a triage be completed? ---Yes Related visit to physician within the last 2 weeks? ---No Does the PT have any chronic conditions? (i.e. diabetes, asthma, etc.) ---No Is this a behavioral health or substance abuse call? ---No Guidelines Guideline Title Affirmed Question Affirmed Notes Cough - Acute Productive [1] Fever > 101 F (38.3 C) AND [2] age > 13 Final Disposition User See Physician within 4 Hours (or PCP triage) Ronnald Ramp, RN, Miranda Comments Appt scheduled for today at 10am with Dr. Sharlet Salina Referrals REFERRED TO PCP OFFICE Disagree/Comply: Comply

## 2016-06-08 DIAGNOSIS — N644 Mastodynia: Secondary | ICD-10-CM | POA: Diagnosis not present

## 2016-06-09 ENCOUNTER — Other Ambulatory Visit: Payer: Self-pay | Admitting: Obstetrics and Gynecology

## 2016-06-09 DIAGNOSIS — N63 Unspecified lump in unspecified breast: Secondary | ICD-10-CM

## 2016-06-12 ENCOUNTER — Ambulatory Visit
Admission: RE | Admit: 2016-06-12 | Discharge: 2016-06-12 | Disposition: A | Discharge status: Home / Self Care | Payer: 59 | Source: Ambulatory Visit | Attending: Obstetrics and Gynecology | Admitting: Obstetrics and Gynecology

## 2016-06-12 DIAGNOSIS — N63 Unspecified lump in unspecified breast: Secondary | ICD-10-CM

## 2016-06-12 DIAGNOSIS — N6489 Other specified disorders of breast: Secondary | ICD-10-CM | POA: Diagnosis not present

## 2016-06-12 DIAGNOSIS — R928 Other abnormal and inconclusive findings on diagnostic imaging of breast: Secondary | ICD-10-CM | POA: Diagnosis not present

## 2016-08-03 ENCOUNTER — Other Ambulatory Visit: Payer: Self-pay | Admitting: Obstetrics and Gynecology

## 2016-08-03 DIAGNOSIS — Z1231 Encounter for screening mammogram for malignant neoplasm of breast: Secondary | ICD-10-CM

## 2016-09-02 DIAGNOSIS — H524 Presbyopia: Secondary | ICD-10-CM | POA: Diagnosis not present

## 2016-09-19 ENCOUNTER — Ambulatory Visit
Admission: RE | Admit: 2016-09-19 | Discharge: 2016-09-19 | Disposition: A | Discharge status: Home / Self Care | Payer: 59 | Source: Ambulatory Visit | Attending: Obstetrics and Gynecology | Admitting: Obstetrics and Gynecology

## 2016-09-19 DIAGNOSIS — Z1231 Encounter for screening mammogram for malignant neoplasm of breast: Secondary | ICD-10-CM | POA: Diagnosis not present

## 2016-09-20 LAB — HM MAMMOGRAPHY

## 2016-11-06 DIAGNOSIS — Z01419 Encounter for gynecological examination (general) (routine) without abnormal findings: Secondary | ICD-10-CM | POA: Diagnosis not present

## 2016-11-06 MED FILL — HYDROCORTISONE 2.5% OINT: 2.5 | 14 days supply | Qty: 28 | Fill #0

## 2016-11-08 ENCOUNTER — Other Ambulatory Visit: Payer: Self-pay | Admitting: Obstetrics and Gynecology

## 2016-11-08 DIAGNOSIS — E2839 Other primary ovarian failure: Secondary | ICD-10-CM

## 2016-11-27 ENCOUNTER — Other Ambulatory Visit: Payer: 59

## 2016-12-04 ENCOUNTER — Ambulatory Visit
Admission: RE | Admit: 2016-12-04 | Discharge: 2016-12-04 | Disposition: A | Discharge status: Home / Self Care | Payer: 59 | Source: Ambulatory Visit | Attending: Obstetrics and Gynecology | Admitting: Obstetrics and Gynecology

## 2016-12-04 DIAGNOSIS — E2839 Other primary ovarian failure: Secondary | ICD-10-CM

## 2016-12-04 DIAGNOSIS — M85852 Other specified disorders of bone density and structure, left thigh: Secondary | ICD-10-CM | POA: Diagnosis not present

## 2016-12-04 DIAGNOSIS — Z780 Asymptomatic menopausal state: Secondary | ICD-10-CM | POA: Diagnosis not present

## 2017-08-28 ENCOUNTER — Other Ambulatory Visit: Payer: Self-pay | Admitting: Obstetrics and Gynecology

## 2017-08-28 DIAGNOSIS — Z1231 Encounter for screening mammogram for malignant neoplasm of breast: Secondary | ICD-10-CM

## 2017-09-20 ENCOUNTER — Ambulatory Visit
Admission: RE | Admit: 2017-09-20 | Discharge: 2017-09-20 | Disposition: A | Discharge status: Home / Self Care | Payer: Medicare Other | Source: Ambulatory Visit | Attending: Obstetrics and Gynecology | Admitting: Obstetrics and Gynecology

## 2017-09-20 DIAGNOSIS — Z1231 Encounter for screening mammogram for malignant neoplasm of breast: Secondary | ICD-10-CM

## 2017-09-20 LAB — HM MAMMOGRAPHY

## 2017-09-21 ENCOUNTER — Other Ambulatory Visit: Payer: Self-pay | Admitting: Obstetrics and Gynecology

## 2017-09-21 DIAGNOSIS — R928 Other abnormal and inconclusive findings on diagnostic imaging of breast: Secondary | ICD-10-CM

## 2017-09-25 ENCOUNTER — Ambulatory Visit
Admission: RE | Admit: 2017-09-25 | Discharge: 2017-09-25 | Disposition: A | Discharge status: Home / Self Care | Payer: Medicare Other | Source: Ambulatory Visit | Attending: Obstetrics and Gynecology | Admitting: Obstetrics and Gynecology

## 2017-09-25 DIAGNOSIS — R928 Other abnormal and inconclusive findings on diagnostic imaging of breast: Secondary | ICD-10-CM

## 2018-01-09 LAB — HEMOGLOBIN A1C: Hemoglobin A1C: 5.5

## 2018-01-09 LAB — BASIC METABOLIC PANEL
BUN: 8 (ref 4–21)
Creatinine: 0.8 (ref 0.5–1.1)
GLUCOSE: 111
Potassium: 3.8 (ref 3.4–5.3)
SODIUM: 142 (ref 137–147)

## 2018-01-09 LAB — LIPID PANEL
CHOLESTEROL: 200 (ref 0–200)
HDL: 73 — AB (ref 35–70)
LDL CALC: 108
LDl/HDL Ratio: 1.5
Triglycerides: 96 (ref 40–160)

## 2018-01-09 LAB — TSH: TSH: 1.69 (ref 0.41–5.90)

## 2018-01-09 LAB — HEPATIC FUNCTION PANEL
ALT: 22 (ref 7–35)
AST: 21 (ref 13–35)
Alkaline Phosphatase: 84 (ref 25–125)

## 2018-01-09 LAB — VITAMIN D 25 HYDROXY (VIT D DEFICIENCY, FRACTURES): Vit D, 25-Hydroxy: 25.5

## 2018-01-09 LAB — CBC AND DIFFERENTIAL
HEMATOCRIT: 39 (ref 36–46)
Hemoglobin: 13.8 (ref 12.0–16.0)
PLATELETS: 227 (ref 150–399)
WBC: 7.8

## 2018-03-06 ENCOUNTER — Encounter: Payer: Self-pay | Admitting: Internal Medicine

## 2018-03-06 ENCOUNTER — Encounter: Payer: Self-pay | Admitting: Gastroenterology

## 2018-03-07 ENCOUNTER — Encounter: Payer: Self-pay | Admitting: Gastroenterology

## 2018-03-07 ENCOUNTER — Encounter: Payer: Self-pay | Admitting: Internal Medicine

## 2018-03-28 DIAGNOSIS — N941 Unspecified dyspareunia: Secondary | ICD-10-CM | POA: Insufficient documentation

## 2018-03-28 DIAGNOSIS — N942 Vaginismus: Secondary | ICD-10-CM | POA: Insufficient documentation

## 2018-04-09 ENCOUNTER — Other Ambulatory Visit: Payer: Self-pay

## 2018-04-09 ENCOUNTER — Ambulatory Visit (AMBULATORY_SURGERY_CENTER): Payer: Self-pay | Admitting: *Deleted

## 2018-04-09 VITALS — Ht 67.0 in | Wt 177.0 lb

## 2018-04-09 DIAGNOSIS — Z800 Family history of malignant neoplasm of digestive organs: Secondary | ICD-10-CM

## 2018-04-09 MED ORDER — NA SULFATE-K SULFATE-MG SULF 17.5-3.13-1.6 GM/177ML PO SOLN: 1.0000 | Freq: Once | ORAL | 0 refills | Status: AC

## 2018-04-09 MED FILL — SUPREP BOWEL PREP KIT: 17.5-3.13-1 | 1 days supply | Qty: 354 | Fill #0

## 2018-04-09 NOTE — Progress Notes (Signed)
No egg or soy allergy known to patient  No issues with past sedation with any surgeries  or procedures, no intubation problems - hard to wake post op per pt  No diet pills per patient No home 02 use per patient  No blood thinners per patient  Pt denies issues with constipation  No A fib or A flutter  EMMI video sent to pt's e mail - declined

## 2018-04-23 ENCOUNTER — Encounter: Payer: Medicare Other | Admitting: Gastroenterology

## 2018-08-14 MED FILL — MELOXICAM 15 MG TABLET: 15 | 30 days supply | Qty: 30 | Fill #0

## 2018-08-15 DIAGNOSIS — S92355A Nondisplaced fracture of fifth metatarsal bone, left foot, initial encounter for closed fracture: Secondary | ICD-10-CM | POA: Insufficient documentation

## 2018-08-15 DIAGNOSIS — S93402D Sprain of unspecified ligament of left ankle, subsequent encounter: Secondary | ICD-10-CM | POA: Insufficient documentation

## 2018-10-01 ENCOUNTER — Encounter: Payer: Self-pay | Admitting: Gastroenterology

## 2018-10-28 ENCOUNTER — Encounter: Payer: Self-pay | Admitting: Internal Medicine

## 2018-10-31 ENCOUNTER — Other Ambulatory Visit: Payer: Self-pay | Admitting: Internal Medicine

## 2018-10-31 DIAGNOSIS — Z1231 Encounter for screening mammogram for malignant neoplasm of breast: Secondary | ICD-10-CM

## 2018-11-01 ENCOUNTER — Ambulatory Visit (AMBULATORY_SURGERY_CENTER): Payer: Self-pay | Admitting: *Deleted

## 2018-11-01 ENCOUNTER — Other Ambulatory Visit: Payer: Self-pay

## 2018-11-01 VITALS — Temp 97.3°F | Ht 67.0 in | Wt 177.0 lb

## 2018-11-01 DIAGNOSIS — Z800 Family history of malignant neoplasm of digestive organs: Secondary | ICD-10-CM

## 2018-11-01 NOTE — Progress Notes (Signed)
No egg or soy allergy known to patient  issues with past sedation with any surgeries  or procedures difficult to wake post op and hypotension, no intubation problems  No diet pills per patient No home 02 use per patient  No blood thinners per patient  Pt denies issues with constipation  No A fib or A flutter  EMMI video sent to pt's e mail   Due to the COVID-19 pandemic we are asking patients to follow these guidelines. Please only bring one care partner. Please be aware that your care partner may wait in the car in the parking lot or if they feel like they will be too hot to wait in the car, they may wait in the lobby on the 4th floor. All care partners are required to wear a mask the entire time (we do not have any that we can provide them), they need to practice social distancing, and we will do a Covid check for all patient's and care partners when you arrive. Also we will check their temperature and your temperature. If the care partner waits in their car they need to stay in the parking lot the entire time and we will call them on their cell phone when the patient is ready for discharge so they can bring the car to the front of the building. Also all patient's will need to wear a mask into building.

## 2018-11-04 ENCOUNTER — Ambulatory Visit
Admission: RE | Admit: 2018-11-04 | Discharge: 2018-11-04 | Disposition: A | Discharge status: Home / Self Care | Payer: Medicare Other | Source: Ambulatory Visit | Attending: Internal Medicine | Admitting: Internal Medicine

## 2018-11-04 ENCOUNTER — Other Ambulatory Visit: Payer: Self-pay

## 2018-11-04 DIAGNOSIS — Z1231 Encounter for screening mammogram for malignant neoplasm of breast: Secondary | ICD-10-CM

## 2018-11-04 LAB — HM MAMMOGRAPHY

## 2018-11-05 ENCOUNTER — Other Ambulatory Visit: Payer: Self-pay | Admitting: Internal Medicine

## 2018-11-05 DIAGNOSIS — R928 Other abnormal and inconclusive findings on diagnostic imaging of breast: Secondary | ICD-10-CM

## 2018-11-06 ENCOUNTER — Ambulatory Visit
Admission: RE | Admit: 2018-11-06 | Discharge: 2018-11-06 | Disposition: A | Discharge status: Home / Self Care | Payer: Medicare Other | Source: Ambulatory Visit | Attending: Internal Medicine | Admitting: Internal Medicine

## 2018-11-06 ENCOUNTER — Other Ambulatory Visit: Payer: Self-pay

## 2018-11-06 DIAGNOSIS — R928 Other abnormal and inconclusive findings on diagnostic imaging of breast: Secondary | ICD-10-CM

## 2018-11-06 LAB — HM MAMMOGRAPHY

## 2018-11-14 ENCOUNTER — Telehealth: Payer: Self-pay

## 2018-11-14 NOTE — Telephone Encounter (Signed)
Covid-19 screening questions   Do you now or have you had a fever in the last 14 days?  Do you have any respiratory symptoms of shortness of breath or cough now or in the last 14 days?  Do you have any family members or close contacts with diagnosed or suspected Covid-19 in the past 14 days?  Have you been tested for Covid-19 and found to be positive?       

## 2018-11-14 NOTE — Telephone Encounter (Signed)
Pt responded "no" to all screening questions °

## 2018-11-15 ENCOUNTER — Ambulatory Visit (AMBULATORY_SURGERY_CENTER): Payer: Medicare Other | Admitting: Gastroenterology

## 2018-11-15 ENCOUNTER — Other Ambulatory Visit: Payer: Self-pay

## 2018-11-15 ENCOUNTER — Encounter: Payer: Self-pay | Admitting: Gastroenterology

## 2018-11-15 VITALS — BP 121/56 | HR 69 | Temp 98.3°F | Resp 19 | Ht 67.0 in | Wt 177.0 lb

## 2018-11-15 DIAGNOSIS — D124 Benign neoplasm of descending colon: Secondary | ICD-10-CM

## 2018-11-15 DIAGNOSIS — D122 Benign neoplasm of ascending colon: Secondary | ICD-10-CM | POA: Diagnosis not present

## 2018-11-15 DIAGNOSIS — Z800 Family history of malignant neoplasm of digestive organs: Secondary | ICD-10-CM | POA: Diagnosis present

## 2018-11-15 LAB — HM COLONOSCOPY

## 2018-11-15 MED ORDER — SODIUM CHLORIDE 0.9 % IV SOLN: 500.0000 mL | Freq: Once | INTRAVENOUS | Status: DC

## 2018-11-15 NOTE — Op Note (Signed)
[place] Patient Name: Joanna Anthony Procedure Date: 11/15/2018 8:13 AM MRN: [id number] Endoscopist: Thornton Park MD, MD Age: 68 Referring MD:  Date of Birth: 1950/04/05 Gender: Female Account #: [id number] Procedure:                Colonoscopy Indications:              Screening in patient at increased risk: Family                            history of 1st-degree relative with colorectal                            cancer before age 41 years                           Mother with colon cancer at age 6. Brother and                            sister with colon polyps.                           Normal colonoscopy in 2002 and 2009. Hyperplastic                            polyp removed on colonoscopy 2015. Olevia Perches)                           No baseline GI symptoms. Medicines:                See the Anesthesia note for documentation of the                            administered medications Procedure:                Pre-Anesthesia Assessment:                           - Prior to the procedure, a History and Physical                            was performed, and patient medications and                            allergies were reviewed. The patient's tolerance of                            previous anesthesia was also reviewed. The risks                            and benefits of the procedure and the sedation                            options and risks were discussed with the patient.  All questions were answered, and informed consent                            was obtained. Prior Anticoagulants: The patient has                            taken no previous anticoagulant or antiplatelet                            agents. ASA Grade Assessment: II - A patient with                            mild systemic disease. After reviewing the risks                            and benefits, the patient was deemed in                            satisfactory  condition to undergo the procedure.                           After obtaining informed consent, the colonoscope                            was passed under direct vision. Throughout the                            procedure, the patient's blood pressure, pulse, and                            oxygen saturations were monitored continuously. The                            Colonoscope was introduced through the anus and                            advanced to the the terminal ileum, with                            identification of the appendiceal orifice and IC                            valve. A second forward view of the right colon was                            performed. The colonoscopy was performed without                            difficulty. The patient tolerated the procedure                            well. The quality of the bowel preparation was  good. The terminal ileum, ileocecal valve,                            appendiceal orifice, and rectum were photographed. Scope In: 8:29:18 AM Scope Out: [id number] Scope Withdrawal Time: 0 hours 15 minutes 42 seconds  Total Procedure Duration: 0 hours 20 minutes 1 second  Findings:                 The perianal and digital rectal examinations were                            normal.                           Non-bleeding internal hemorrhoids were found.                           A few small-mouthed diverticula were found in the                            sigmoid colon and descending colon.                           Two sessile polyps were found in the ascending                            colon. The polyps were 1 to 3 mm in size. These                            polyps were removed with a cold snare. Resection                            and retrieval were complete. Estimated blood loss                            was minimal.                           Three sessile polyps were found in the descending                             colon. The polyps were 2 to 5 mm in size. These                            polyps were removed with a cold snare. Resection                            and retrieval were complete. Estimated blood loss                            was minimal.                           The exam was otherwise without abnormality on  direct and retroflexion views. Complications:            No immediate complications. Estimated blood loss:                            Minimal. Estimated Blood Loss:     Estimated blood loss: none. Impression:               - Non-bleeding internal hemorrhoids.                           - Diverticulosis in the sigmoid colon and in the                            descending colon.                           - Two 1 to 3 mm polyps in the ascending colon,                            removed with a cold snare. Resected and retrieved.                           - Three 2 to 5 mm polyps in the descending colon,                            removed with a cold snare. Resected and retrieved.                           - The examination was otherwise normal on direct                            and retroflexion views. Recommendation:           - Patient has a contact number available for                            emergencies. The signs and symptoms of potential                            delayed complications were discussed with the                            patient. Return to normal activities tomorrow.                            Written discharge instructions were provided to the                            patient.                           - Resume previous diet today. High fiber diet                            recommended.                           -  Continue present medications.                           - Await pathology results.                           - Repeat colonoscopy date to be determined after                            pending pathology  results are reviewed for                            surveillance of multiple polyps. Thornton Park MD, MD 11/15/2018 8:55:04 AM This report has been signed electronically.

## 2018-11-15 NOTE — Progress Notes (Signed)
A and O x3. Report to RN. Tolerated MAC anesthesia well.

## 2018-11-15 NOTE — Progress Notes (Signed)
Pt's states no medical or surgical changes since previsit or office visit. 

## 2018-11-15 NOTE — Patient Instructions (Signed)
Impression/Recommendations:  Diverticulosis handout given to patient. Hemorrhoid handout given to patient. Polyp handout given to patient. High fiber diet handout given to patient. Resume previous diet.  Recommend high fiber.  Continue present medications. Await patholgy results.  Repeat colonoscopy date to be determined after pathology results reviewed.  YOU HAD AN ENDOSCOPIC PROCEDURE TODAY AT [facility]:   Refer to the procedure report that was given to you for any specific questions about what was found during the examination.  If the procedure report does not answer your questions, please call your gastroenterologist to clarify.  If you requested that your care partner not be given the details of your procedure findings, then the procedure report has been included in a sealed envelope for you to review at your convenience later.  YOU SHOULD EXPECT: Some feelings of bloating in the abdomen. Passage of more gas than usual.  Walking can help get rid of the air that was put into your GI tract during the procedure and reduce the bloating. If you had a lower endoscopy (such as a colonoscopy or flexible sigmoidoscopy) you may notice spotting of blood in your stool or on the toilet paper. If you underwent a bowel prep for your procedure, you may not have a normal bowel movement for a few days.  Please Note:  You might notice some irritation and congestion in your nose or some drainage.  This is from the oxygen used during your procedure.  There is no need for concern and it should clear up in a day or so.  SYMPTOMS TO REPORT IMMEDIATELY:   Following lower endoscopy (colonoscopy or flexible sigmoidoscopy):  Excessive amounts of blood in the stool  Significant tenderness or worsening of abdominal pains  Swelling of the abdomen that is new, acute  Fever of 100F or higher For urgent or emergent issues, a gastroenterologist can be reached at any hour by calling [phone]  [phone].   DIET:  We do recommend a small meal at first, but then you may proceed to your regular diet.  Drink plenty of fluids but you should avoid alcoholic beverages for 24 hours.  ACTIVITY:  You should plan to take it easy for the rest of today and you should NOT DRIVE or use heavy machinery until tomorrow (because of the sedation medicines used during the test).    FOLLOW UP: Our staff will call the number listed on your records 48-72 hours following your procedure to check on you and address any questions or concerns that you may have regarding the information given to you following your procedure. If we do not reach you, we will leave a message.  We will attempt to reach you two times.  During this call, we will ask if you have developed any symptoms of COVID 19. If you develop any symptoms (ie: fever, flu-like symptoms, shortness of breath, cough etc.) before then, please call [phone].  If you test positive for Covid 19 in the 2 weeks post procedure, please call and report this information to [place].    If any biopsies were taken you will be contacted by phone or by letter within the next 1-3 weeks.  Please call us at [phone] if you have not heard about the biopsies in 3 weeks.    SIGNATURES/CONFIDENTIALITY: You and/or your care partner have signed paperwork which will be entered into your electronic medical record.  These signatures attest to the fact that that the information above on your After Visit Summary has  been reviewed and is understood.  Full responsibility of the confidentiality of this discharge information lies with you and/or your care-partner.

## 2018-11-15 NOTE — Progress Notes (Signed)
Temperature taken by K.A., VS taken by C.W. 

## 2018-11-19 ENCOUNTER — Telehealth: Payer: Self-pay | Admitting: *Deleted

## 2018-11-19 ENCOUNTER — Telehealth: Payer: Self-pay

## 2018-11-19 ENCOUNTER — Encounter: Payer: Self-pay | Admitting: Gastroenterology

## 2018-11-19 NOTE — Telephone Encounter (Signed)
No answer for post procedure follow up call left message and will call patient back this afternoon. SM

## 2018-11-19 NOTE — Telephone Encounter (Signed)
Second attempt follow up call to pt, lm on vm. 

## 2019-02-11 ENCOUNTER — Encounter: Payer: Self-pay | Admitting: Internal Medicine

## 2019-02-21 ENCOUNTER — Ambulatory Visit: Payer: Medicare Other | Attending: Internal Medicine

## 2019-02-21 DIAGNOSIS — Z23 Encounter for immunization: Secondary | ICD-10-CM

## 2019-02-21 NOTE — Progress Notes (Signed)
   Covid-19 Vaccination Clinic  Name:  Joanna Anthony    MRN: [id number] DOB: [birth year]  02/21/2019  [name] was observed post Covid-19 immunization for 15 minutes without incidence. She was provided with Vaccine Information Sheet and instruction to access the V-Safe system.   [name] was instructed to call 911 with any severe reactions post vaccine: Marland Kitchen Difficulty breathing  . Swelling of your face and throat  . A fast heartbeat  . A bad rash all over your body  . Dizziness and weakness    Immunizations Administered    Name Date Dose VIS Date Route   Pfizer COVID-19 Vaccine 02/21/2019  3:34 PM 0.3 mL 01/10/2019 Intramuscular   Manufacturer: [place]   Lot: [id number]   [place]: [id number]

## 2019-03-15 ENCOUNTER — Ambulatory Visit: Payer: Medicare Other | Attending: Internal Medicine

## 2019-03-15 DIAGNOSIS — Z23 Encounter for immunization: Secondary | ICD-10-CM

## 2019-03-15 NOTE — Progress Notes (Signed)
   Covid-19 Vaccination Clinic  Name:  Joanna Anthony    MRN: [id number] DOB: 1950-05-05  03/15/2019  [name] was observed post Covid-19 immunization for  15 minutes without incidence. She was provided with Vaccine Information Sheet and instruction to access the V-Safe system.   [name] was instructed to call 911 with any severe reactions post vaccine: Marland Kitchen Difficulty breathing  . Swelling of your face and throat  . A fast heartbeat  . A bad rash all over your body  . Dizziness and weakness    Immunizations Administered    Name Date Dose VIS Date Route   Pfizer COVID-19 Vaccine 03/15/2019 10:37 AM 0.3 mL 01/10/2019 Intramuscular   Manufacturer: [place]   Lot: [id number]   [place]: [id number]

## 2019-03-28 ENCOUNTER — Ambulatory Visit: Payer: Medicare Other

## 2019-04-22 ENCOUNTER — Encounter: Payer: Self-pay | Admitting: Internal Medicine

## 2019-04-24 ENCOUNTER — Encounter: Payer: Self-pay | Admitting: Internal Medicine

## 2019-04-29 ENCOUNTER — Encounter: Payer: Self-pay | Admitting: Internal Medicine

## 2019-04-29 ENCOUNTER — Other Ambulatory Visit: Payer: Self-pay

## 2019-04-29 ENCOUNTER — Ambulatory Visit (INDEPENDENT_AMBULATORY_CARE_PROVIDER_SITE_OTHER): Payer: Medicare Other | Admitting: Internal Medicine

## 2019-04-29 VITALS — BP 144/94 | HR 69 | Temp 98.3°F | Resp 16 | Ht 67.0 in | Wt 171.4 lb

## 2019-04-29 DIAGNOSIS — E785 Hyperlipidemia, unspecified: Secondary | ICD-10-CM

## 2019-04-29 DIAGNOSIS — I10 Essential (primary) hypertension: Secondary | ICD-10-CM | POA: Diagnosis not present

## 2019-04-29 LAB — LIPID PANEL
Cholesterol: 210 mg/dL — ABNORMAL HIGH (ref 0–200)
HDL: 60.4 mg/dL (ref >39.00)
LDL Cholesterol: 133 mg/dL — ABNORMAL HIGH (ref 0–99)
NonHDL: 149.97
Total CHOL/HDL Ratio: 3
Triglycerides: 85 mg/dL (ref 0.0–149.0)
VLDL: 17 mg/dL (ref 0.0–40.0)

## 2019-04-29 LAB — CBC WITH DIFFERENTIAL/PLATELET
Basophils Absolute: 0 10*3/uL (ref 0.0–0.1)
Basophils Relative: 0.6 % (ref 0.0–3.0)
Eosinophils Absolute: 0.2 10*3/uL (ref 0.0–0.7)
Eosinophils Relative: 4.4 % (ref 0.0–5.0)
HCT: 42.6 % (ref 36.0–46.0)
Hemoglobin: 14.6 g/dL (ref 12.0–15.0)
Lymphocytes Relative: 37.4 % (ref 12.0–46.0)
Lymphs Abs: 1.8 10*3/uL (ref 0.7–4.0)
MCHC: 34.3 g/dL (ref 30.0–36.0)
MCV: 85.6 fl (ref 78.0–100.0)
Monocytes Absolute: 0.4 10*3/uL (ref 0.1–1.0)
Monocytes Relative: 8.2 % (ref 3.0–12.0)
Neutro Abs: 2.4 10*3/uL (ref 1.4–7.7)
Neutrophils Relative %: 49.4 % (ref 43.0–77.0)
Platelets: 213 10*3/uL (ref 150.0–400.0)
RBC: 4.97 Mil/uL (ref 3.87–5.11)
RDW: 13 % (ref 11.5–15.5)
WBC: 4.8 10*3/uL (ref 4.0–10.5)

## 2019-04-29 LAB — URINALYSIS, ROUTINE W REFLEX MICROSCOPIC
Bilirubin Urine: NEGATIVE
Hgb urine dipstick: NEGATIVE
Nitrite: NEGATIVE
RBC / HPF: NONE SEEN (ref 0–?)
Specific Gravity, Urine: 1.015 (ref 1.000–1.030)
Total Protein, Urine: NEGATIVE
Urine Glucose: NEGATIVE
Urobilinogen, UA: 0.2 (ref 0.0–1.0)
pH: 6.5 (ref 5.0–8.0)

## 2019-04-29 LAB — HEPATIC FUNCTION PANEL
ALT: 21 U/L (ref 0–35)
AST: 20 U/L (ref 0–37)
Albumin: 4.4 g/dL (ref 3.5–5.2)
Alkaline Phosphatase: 73 U/L (ref 39–117)
Bilirubin, Direct: 0.1 mg/dL (ref 0.0–0.3)
Total Bilirubin: 0.6 mg/dL (ref 0.2–1.2)
Total Protein: 7.5 g/dL (ref 6.0–8.3)

## 2019-04-29 LAB — BASIC METABOLIC PANEL
BUN: 10 mg/dL (ref 6–23)
CO2: 28 mEq/L (ref 19–32)
Calcium: 9.3 mg/dL (ref 8.4–10.5)
Chloride: 104 mEq/L (ref 96–112)
Creatinine, Ser: 0.8 mg/dL (ref 0.40–1.20)
GFR: 71.52 mL/min (ref >60.00)
Glucose, Bld: 98 mg/dL (ref 70–99)
Potassium: 4 mEq/L (ref 3.5–5.1)
Sodium: 138 mEq/L (ref 135–145)

## 2019-04-29 LAB — TSH: TSH: 1.25 u[IU]/mL (ref 0.35–4.50)

## 2019-04-29 NOTE — Patient Instructions (Signed)

## 2019-04-29 NOTE — Progress Notes (Signed)
Subjective:  Patient ID: Joanna Anthony, female    DOB: 06/22/1950  Age: 69 y.o. MRN: [id number]  CC: Hypertension and Hyperlipidemia   This visit occurred during the SARS-CoV-2 public health emergency.  Safety protocols were in place, including screening questions prior to the visit, additional usage of staff PPE, and extensive cleaning of exam room while observing appropriate contact time as indicated for disinfecting solutions.    HPI PATRICE AVETISYAN presents for f/up - She has a history of hypertension that she controls with lifestyle modifications.  She is not currently taking any medications.  She is active and denies any recent episodes of CP, DOE, palpitations, edema, or fatigue.  She tells me her blood pressure is well controlled at home but she has the whitecoat phenomenon.  Outpatient Medications Prior to Visit  Medication Sig Dispense Refill  . calcium carbonate (OS-CAL) 1250 (500 Ca) MG chewable tablet Chew 1 tablet by mouth daily.    . cholecalciferol (VITAMIN D3) 25 MCG (1000 UT) tablet Take 2,000 Units by mouth daily.    . Magnesium Oxide 250 MG TABS Take by mouth.    . polyethylene glycol powder (GLYCOLAX/MIRALAX) 17 GM/SCOOP powder polyethylene glycol 3350 17 gram/dose oral powder    . calcium carbonate (OS-CAL) 1250 (500 Ca) MG chewable tablet Chew 1 tablet by mouth daily.    . Magnesium Oxide 250 MG TABS magnesium 250 mg (as magnesium oxide) tablet  Take by oral route.    . calcium carbonate (TUMS EX) 750 MG chewable tablet Chew 1 tablet by mouth daily.    Marland Kitchen ibuprofen (ADVIL,MOTRIN) 200 MG tablet Take 600 mg by mouth every 4 (four) hours as needed for moderate pain.     . Magnesium 100 MG CAPS magnesium 250 mg (as magnesium oxide) tablet  Take by oral route.    . pseudoephedrine-acetaminophen (TYLENOL SINUS) 30-500 MG TABS tablet Take 1 tablet by mouth every 6 (six) hours as needed.     No facility-administered medications prior to visit.    ROS Review of  Systems  Constitutional: Negative for diaphoresis and fatigue.  HENT: Negative.   Eyes: Negative for visual disturbance.  Respiratory: Negative for cough, chest tightness, shortness of breath and wheezing.   Cardiovascular: Negative for chest pain, palpitations and leg swelling.  Gastrointestinal: Negative for abdominal pain, constipation, diarrhea, nausea and vomiting.  Genitourinary: Negative.  Negative for difficulty urinating and hematuria.  Musculoskeletal: Negative.  Negative for arthralgias and myalgias.  Skin: Negative.  Negative for color change.  Neurological: Negative.  Negative for dizziness, weakness and light-headedness.  Hematological: Negative for adenopathy. Does not bruise/bleed easily.  Psychiatric/Behavioral: Negative.     Objective:  BP (!) 144/94 (BP Location: Left Arm, Patient Position: Sitting, Cuff Size: Large)   Pulse 69   Temp 98.3 F (36.8 C) (Oral)   Resp 16   Ht 5\' 7"  (1.702 m)   Wt 171 lb 6 oz (77.7 kg)   SpO2 98%   BMI 26.84 kg/m   BP Readings from Last 3 Encounters:  04/29/19 (!) 144/94  11/15/18 (!) 121/56  01/25/16 (!) 150/70    Wt Readings from Last 3 Encounters:  04/29/19 171 lb 6 oz (77.7 kg)  11/15/18 177 lb (80.3 kg)  11/01/18 177 lb (80.3 kg)    Physical Exam Vitals reviewed.  HENT:     Nose: Nose normal.     Mouth/Throat:     Mouth: Mucous membranes are moist.  Eyes:     General: No  scleral icterus.    Conjunctiva/sclera: Conjunctivae normal.  Cardiovascular:     Rate and Rhythm: Normal rate and regular rhythm.     Heart sounds: No murmur.     Comments: EKG - Sinus rhythm with 1 PVC TWI in anterior leads Q in III No LVH No old EKG's for comparison Pulmonary:     Effort: Pulmonary effort is normal.     Breath sounds: No stridor. No wheezing, rhonchi or rales.  Abdominal:     General: Abdomen is flat. Bowel sounds are normal. There is no distension.     Palpations: Abdomen is soft. There is no hepatomegaly,  splenomegaly or mass.     Tenderness: There is no abdominal tenderness.  Musculoskeletal:     Cervical back: Neck supple.     Right lower leg: No edema.     Left lower leg: No edema.  Lymphadenopathy:     Cervical: No cervical adenopathy.  Skin:    General: Skin is warm and dry.  Neurological:     General: No focal deficit present.     Mental Status: She is alert.  Psychiatric:        Mood and Affect: Mood normal.        Behavior: Behavior normal.     Lab Results  Component Value Date   WBC 4.8 04/29/2019   HGB 14.6 04/29/2019   HCT 42.6 04/29/2019   PLT 213.0 04/29/2019   GLUCOSE 98 04/29/2019   CHOL 210 (H) 04/29/2019   TRIG 85.0 04/29/2019   HDL 60.40 04/29/2019   LDLDIRECT 135.7 01/12/2012   LDLCALC 133 (H) 04/29/2019   ALT 21 04/29/2019   AST 20 04/29/2019   NA 138 04/29/2019   K 4.0 04/29/2019   CL 104 04/29/2019   CREATININE 0.80 04/29/2019   BUN 10 04/29/2019   CO2 28 04/29/2019   TSH 1.25 04/29/2019   HGBA1C 5.5 01/09/2018    US BREAST LTD UNI RIGHT INC AXILLA  Result Date: 11/06/2018 CLINICAL DATA:  Patient was recalled from screening for a possible right breast asymmetry. EXAM: DIGITAL DIAGNOSTIC RIGHT MAMMOGRAM WITH TOMO ULTRASOUND RIGHT BREAST COMPARISON:  Previous exam(s). ACR Breast Density Category c: The breast tissue is heterogeneously dense, which may obscure small masses. FINDINGS: Mammogram: Additional spot compression views were performed for the questioned asymmetry best seen on the MLO view in the right breast. On the additional imaging there is dispersion of the tissue in this area with no underlying mass or distortion. No suspicious finding identified. Ultrasound: Targeted ultrasound is performed from 12-2 o'clock in the right breast demonstrating normal fibroglandular tissue. No discrete cystic or solid masses identified. No sonographic evidence of malignancy. IMPRESSION: No suspicious mammographic or sonographic finding at the site of the  questioned asymmetry in the right breast. RECOMMENDATION: Screening mammogram in one year.(Code:SM-B-01Y) I have discussed the findings and recommendations with the patient. If applicable, a reminder letter will be sent to the patient regarding the next appointment. BI-RADS CATEGORY  2: Benign. Electronically Signed   By: Audie Pinto M.D.   On: 11/06/2018 11:49   MM DIAG BREAST TOMO UNI RIGHT  Result Date: 11/06/2018 CLINICAL DATA:  Patient was recalled from screening for a possible right breast asymmetry. EXAM: DIGITAL DIAGNOSTIC RIGHT MAMMOGRAM WITH TOMO ULTRASOUND RIGHT BREAST COMPARISON:  Previous exam(s). ACR Breast Density Category c: The breast tissue is heterogeneously dense, which may obscure small masses. FINDINGS: Mammogram: Additional spot compression views were performed for the questioned asymmetry best seen on the  MLO view in the right breast. On the additional imaging there is dispersion of the tissue in this area with no underlying mass or distortion. No suspicious finding identified. Ultrasound: Targeted ultrasound is performed from 12-2 o'clock in the right breast demonstrating normal fibroglandular tissue. No discrete cystic or solid masses identified. No sonographic evidence of malignancy. IMPRESSION: No suspicious mammographic or sonographic finding at the site of the questioned asymmetry in the right breast. RECOMMENDATION: Screening mammogram in one year.(Code:SM-B-01Y) I have discussed the findings and recommendations with the patient. If applicable, a reminder letter will be sent to the patient regarding the next appointment. BI-RADS CATEGORY  2: Benign. Electronically Signed   By: Audie Pinto M.D.   On: 11/06/2018 11:49    Assessment & Plan:   Teka was seen today for hypertension and hyperlipidemia.  Diagnoses and all orders for this visit:  Essential hypertension- Her blood pressure is elevated today but based on the information she tells me she has a whitecoat  phenomenon.  Nonetheless, she is not interested in taking an antihypertensive.  Her EKG has insignificant changes but no evidence of LVH.  Her labs are negative for secondary causes or endorgan damage.  She will continue to monitor her blood pressure at home. -     CBC with Differential/Platelet; Future -     Basic metabolic panel; Future -     TSH; Future -     Urinalysis, Routine w reflex microscopic; Future -     EKG 12-Lead -     Urinalysis, Routine w reflex microscopic -     TSH -     Basic metabolic panel -     CBC with Differential/Platelet  Hyperlipidemia LDL goal <130- She does not have an elevated ASCVD risk score so I did not recommend a statin for CV risk reduction. -     Lipid panel; Future -     TSH; Future -     Hepatic function panel; Future -     Hepatic function panel -     TSH -     Lipid panel   I have discontinued Harlin Rain. Quam's ibuprofen, pseudoephedrine-acetaminophen, and Magnesium. I am also having her maintain her cholecalciferol, polyethylene glycol powder, calcium carbonate, and Magnesium Oxide.  No orders of the defined types were placed in this encounter.    Follow-up: Return in about 3 months (around 07/30/2019).  Scarlette Calico, MD

## 2019-07-30 ENCOUNTER — Other Ambulatory Visit: Payer: Self-pay

## 2019-07-30 ENCOUNTER — Encounter: Payer: Self-pay | Admitting: Internal Medicine

## 2019-07-30 ENCOUNTER — Telehealth: Payer: Self-pay

## 2019-07-30 ENCOUNTER — Ambulatory Visit (INDEPENDENT_AMBULATORY_CARE_PROVIDER_SITE_OTHER): Payer: Medicare Other | Admitting: Internal Medicine

## 2019-07-30 VITALS — BP 148/90 | HR 64 | Temp 98.0°F | Resp 16 | Ht 67.0 in | Wt 176.0 lb

## 2019-07-30 DIAGNOSIS — I10 Essential (primary) hypertension: Secondary | ICD-10-CM

## 2019-07-30 MED ORDER — IRBESARTAN 150 MG PO TABS: 150.0000 mg | ORAL_TABLET | Freq: Every day | ORAL | 1 refills | Status: DC

## 2019-07-30 MED ORDER — EDARBI 40 MG PO TABS: 1.0000 | ORAL_TABLET | Freq: Every day | ORAL | 1 refills | Status: DC

## 2019-07-30 NOTE — Telephone Encounter (Signed)
Joanna Anthony is too expensive. I can do a PA for it but there are no previous therapies used so I do not see it getting approved.   Please advise.

## 2019-07-30 NOTE — Telephone Encounter (Signed)
Pt also sent a mychart message about the same.   PCP has sent in irbesartan to replace the Edarbi. Rx was sent to [facility] by mistake.   erx resent to HT at [facility] per pt request.

## 2019-07-30 NOTE — Telephone Encounter (Signed)
Patient calling and states that the Azilsartan Medoxomil (EDARBI) 40 MG TABS  that was called in today, she cannot afford. There is a $400 copay for this medication. Would like to know if there is another medication that could be called in? Please advise.

## 2019-07-30 NOTE — Patient Instructions (Signed)

## 2019-07-30 NOTE — Progress Notes (Signed)
Subjective:  Patient ID: Joanna Anthony, female    DOB: 1950-04-25  Age: 69 y.o. MRN: [id number]  CC: Hypertension  This visit occurred during the SARS-CoV-2 public health emergency.  Safety protocols were in place, including screening questions prior to the visit, additional usage of staff PPE, and extensive cleaning of exam room while observing appropriate contact time as indicated for disinfecting solutions.    HPI Joanna Anthony presents for a BP check - She monitors her blood pressure at home and she says it hovers around 142/90.  She is active and denies any recent episodes of DOE, CP, palpitations, edema, or fatigue.  She does have a mild, intermittent dull frontal headache.  She denies nausea or vomiting.  Outpatient Medications Prior to Visit  Medication Sig Dispense Refill  . calcium carbonate (OS-CAL) 1250 (500 Ca) MG chewable tablet Chew 1 tablet by mouth daily.    . cholecalciferol (VITAMIN D3) 25 MCG (1000 UT) tablet Take 2,000 Units by mouth daily.    . Magnesium Oxide 250 MG TABS Take by mouth.    . polyethylene glycol powder (GLYCOLAX/MIRALAX) 17 GM/SCOOP powder polyethylene glycol 3350 17 gram/dose oral powder     No facility-administered medications prior to visit.    ROS Review of Systems  Constitutional: Negative for diaphoresis, fatigue and unexpected weight change.  HENT: Negative.   Eyes: Negative for visual disturbance.  Respiratory: Negative for cough, chest tightness, shortness of breath and wheezing.   Cardiovascular: Negative for chest pain, palpitations and leg swelling.  Gastrointestinal: Negative for diarrhea, nausea and vomiting.  Genitourinary: Negative.  Negative for difficulty urinating, dysuria and hematuria.  Musculoskeletal: Negative.  Negative for back pain and neck pain.  Skin: Negative.   Neurological: Positive for headaches. Negative for dizziness, weakness, light-headedness and numbness.  Hematological: Negative for adenopathy. Does  not bruise/bleed easily.  Psychiatric/Behavioral: Negative.     Objective:  BP (!) 148/90 (BP Location: Left Arm, Patient Position: Sitting, Cuff Size: Normal)   Pulse 64   Temp 98 F (36.7 C) (Oral)   Resp 16   Ht 5\' 7"  (1.702 m)   Wt 176 lb (79.8 kg)   SpO2 98%   BMI 27.57 kg/m   BP Readings from Last 3 Encounters:  07/30/19 (!) 148/90  04/29/19 (!) 144/94  11/15/18 (!) 121/56    Wt Readings from Last 3 Encounters:  07/30/19 176 lb (79.8 kg)  04/29/19 171 lb 6 oz (77.7 kg)  11/15/18 177 lb (80.3 kg)    Physical Exam Vitals reviewed.  Constitutional:      Appearance: Normal appearance.  HENT:     Nose: Nose normal.     Mouth/Throat:     Mouth: Mucous membranes are moist.  Eyes:     General: No scleral icterus.    Conjunctiva/sclera: Conjunctivae normal.  Cardiovascular:     Rate and Rhythm: Normal rate and regular rhythm.     Pulses: Normal pulses.     Heart sounds: No murmur heard.   Pulmonary:     Effort: Pulmonary effort is normal.     Breath sounds: No stridor. No wheezing, rhonchi or rales.  Abdominal:     General: Abdomen is flat. Bowel sounds are normal. There is no distension.     Palpations: Abdomen is soft. There is no hepatomegaly, splenomegaly or mass.     Tenderness: There is no abdominal tenderness.  Musculoskeletal:        General: Normal range of motion.  Cervical back: Neck supple.     Right lower leg: No edema.     Left lower leg: No edema.  Lymphadenopathy:     Cervical: No cervical adenopathy.  Skin:    General: Skin is warm and dry.     Coloration: Skin is not pale.  Neurological:     General: No focal deficit present.     Mental Status: She is alert and oriented to person, place, and time. Mental status is at baseline.  Psychiatric:        Mood and Affect: Mood normal.        Behavior: Behavior normal.     Lab Results  Component Value Date   WBC 4.8 04/29/2019   HGB 14.6 04/29/2019   HCT 42.6 04/29/2019   PLT 213.0  04/29/2019   GLUCOSE 98 04/29/2019   CHOL 210 (H) 04/29/2019   TRIG 85.0 04/29/2019   HDL 60.40 04/29/2019   LDLDIRECT 135.7 01/12/2012   LDLCALC 133 (H) 04/29/2019   ALT 21 04/29/2019   AST 20 04/29/2019   NA 138 04/29/2019   K 4.0 04/29/2019   CL 104 04/29/2019   CREATININE 0.80 04/29/2019   BUN 10 04/29/2019   CO2 28 04/29/2019   TSH 1.25 04/29/2019   HGBA1C 5.5 01/09/2018    US BREAST LTD UNI RIGHT INC AXILLA  Result Date: 11/06/2018 CLINICAL DATA:  Patient was recalled from screening for a possible right breast asymmetry. EXAM: DIGITAL DIAGNOSTIC RIGHT MAMMOGRAM WITH TOMO ULTRASOUND RIGHT BREAST COMPARISON:  Previous exam(s). ACR Breast Density Category c: The breast tissue is heterogeneously dense, which may obscure small masses. FINDINGS: Mammogram: Additional spot compression views were performed for the questioned asymmetry best seen on the MLO view in the right breast. On the additional imaging there is dispersion of the tissue in this area with no underlying mass or distortion. No suspicious finding identified. Ultrasound: Targeted ultrasound is performed from 12-2 o'clock in the right breast demonstrating normal fibroglandular tissue. No discrete cystic or solid masses identified. No sonographic evidence of malignancy. IMPRESSION: No suspicious mammographic or sonographic finding at the site of the questioned asymmetry in the right breast. RECOMMENDATION: Screening mammogram in one year.(Code:SM-B-01Y) I have discussed the findings and recommendations with the patient. If applicable, a reminder letter will be sent to the patient regarding the next appointment. BI-RADS CATEGORY  2: Benign. Electronically Signed   By: Audie Pinto M.D.   On: 11/06/2018 11:49   MM DIAG BREAST TOMO UNI RIGHT  Result Date: 11/06/2018 CLINICAL DATA:  Patient was recalled from screening for a possible right breast asymmetry. EXAM: DIGITAL DIAGNOSTIC RIGHT MAMMOGRAM WITH TOMO ULTRASOUND RIGHT BREAST  COMPARISON:  Previous exam(s). ACR Breast Density Category c: The breast tissue is heterogeneously dense, which may obscure small masses. FINDINGS: Mammogram: Additional spot compression views were performed for the questioned asymmetry best seen on the MLO view in the right breast. On the additional imaging there is dispersion of the tissue in this area with no underlying mass or distortion. No suspicious finding identified. Ultrasound: Targeted ultrasound is performed from 12-2 o'clock in the right breast demonstrating normal fibroglandular tissue. No discrete cystic or solid masses identified. No sonographic evidence of malignancy. IMPRESSION: No suspicious mammographic or sonographic finding at the site of the questioned asymmetry in the right breast. RECOMMENDATION: Screening mammogram in one year.(Code:SM-B-01Y) I have discussed the findings and recommendations with the patient. If applicable, a reminder letter will be sent to the patient regarding the next appointment. BI-RADS  CATEGORY  2: Benign. Electronically Signed   By: Audie Pinto M.D.   On: 11/06/2018 11:49    Assessment & Plan:   Dyllan was seen today for hypertension.  Diagnoses and all orders for this visit:  Essential hypertension- Her blood pressure is not adequately well controlled and she is mildly symptomatic with a headache.  Evaluation for secondary causes or endorgan damage is otherwise unremarkable.  In addition to lifestyle modifications I have asked her to start taking an ARB. -     Discontinue: Azilsartan Medoxomil (EDARBI) 40 MG TABS; Take 1 tablet by mouth daily. -     Discontinue: irbesartan (AVAPRO) 150 MG tablet; Take 1 tablet (150 mg total) by mouth daily.   I have discontinued Milika Ventress. [employer]'s polyethylene glycol powder and Edarbi. I am also having her maintain her cholecalciferol, calcium carbonate, and Magnesium Oxide.  Meds ordered this encounter  Medications  . DISCONTD: Azilsartan Medoxomil (EDARBI)  40 MG TABS    Sig: Take 1 tablet by mouth daily.    Dispense:  90 tablet    Refill:  1  . DISCONTD: irbesartan (AVAPRO) 150 MG tablet    Sig: Take 1 tablet (150 mg total) by mouth daily.    Dispense:  90 tablet    Refill:  1     Follow-up: Return in about 6 months (around 01/29/2020).  Scarlette Calico, MD

## 2019-11-03 ENCOUNTER — Ambulatory Visit: Payer: Medicare Other | Attending: Internal Medicine

## 2019-11-03 DIAGNOSIS — Z23 Encounter for immunization: Secondary | ICD-10-CM

## 2019-11-03 NOTE — Progress Notes (Signed)
   Covid-[facility] Vaccination [facility]  Name:  Joanna Anthony    MRN: [id number] DOB: November 29, 1950  11/03/2019  [name] was observed post Covid-19 immunization for 15 minutes without incident. She was provided with Vaccine Information Sheet and instruction to access the V-Safe system.   [name] was instructed to call 911 with any severe reactions post vaccine: Marland Kitchen Difficulty breathing  . Swelling of face and throat  . A fast heartbeat  . A bad rash all over body  . Dizziness and weakness

## 2019-11-20 ENCOUNTER — Other Ambulatory Visit: Payer: Self-pay | Admitting: Internal Medicine

## 2019-11-20 ENCOUNTER — Encounter: Payer: Self-pay | Admitting: Internal Medicine

## 2019-11-20 DIAGNOSIS — I10 Essential (primary) hypertension: Secondary | ICD-10-CM

## 2019-11-20 MED ORDER — CANDESARTAN CILEXETIL 16 MG PO TABS: 16.0000 mg | ORAL_TABLET | Freq: Every day | ORAL | 1 refills | Status: DC

## 2019-12-09 ENCOUNTER — Other Ambulatory Visit: Payer: Self-pay | Admitting: Internal Medicine

## 2019-12-09 DIAGNOSIS — Z1231 Encounter for screening mammogram for malignant neoplasm of breast: Secondary | ICD-10-CM

## 2020-01-20 ENCOUNTER — Other Ambulatory Visit: Payer: Self-pay

## 2020-01-20 ENCOUNTER — Ambulatory Visit
Admission: RE | Admit: 2020-01-20 | Discharge: 2020-01-20 | Disposition: A | Discharge status: Home / Self Care | Payer: Medicare Other | Source: Ambulatory Visit | Attending: Internal Medicine | Admitting: Internal Medicine

## 2020-01-20 DIAGNOSIS — Z1231 Encounter for screening mammogram for malignant neoplasm of breast: Secondary | ICD-10-CM

## 2020-01-21 LAB — HM MAMMOGRAPHY

## 2020-02-02 ENCOUNTER — Encounter: Payer: Self-pay | Admitting: Internal Medicine

## 2020-02-02 ENCOUNTER — Ambulatory Visit (INDEPENDENT_AMBULATORY_CARE_PROVIDER_SITE_OTHER): Payer: Medicare Other | Admitting: Internal Medicine

## 2020-02-02 ENCOUNTER — Other Ambulatory Visit: Payer: Self-pay

## 2020-02-02 VITALS — BP 142/88 | HR 77 | Temp 98.2°F | Resp 16 | Ht 67.0 in | Wt 174.4 lb

## 2020-02-02 DIAGNOSIS — I10 Essential (primary) hypertension: Secondary | ICD-10-CM

## 2020-02-02 LAB — CBC WITH DIFFERENTIAL/PLATELET
Basophils Absolute: 0 10*3/uL (ref 0.0–0.1)
Basophils Relative: 0.9 % (ref 0.0–3.0)
Eosinophils Absolute: 0.4 10*3/uL (ref 0.0–0.7)
Eosinophils Relative: 6.3 % — ABNORMAL HIGH (ref 0.0–5.0)
HCT: 43.8 % (ref 36.0–46.0)
Hemoglobin: 15 g/dL (ref 12.0–15.0)
Lymphocytes Relative: 36.9 % (ref 12.0–46.0)
Lymphs Abs: 2.1 10*3/uL (ref 0.7–4.0)
MCHC: 34.3 g/dL (ref 30.0–36.0)
MCV: 84.5 fl (ref 78.0–100.0)
Monocytes Absolute: 0.4 10*3/uL (ref 0.1–1.0)
Monocytes Relative: 7.5 % (ref 3.0–12.0)
Neutro Abs: 2.7 10*3/uL (ref 1.4–7.7)
Neutrophils Relative %: 48.4 % (ref 43.0–77.0)
Platelets: 229 10*3/uL (ref 150.0–400.0)
RBC: 5.18 Mil/uL — ABNORMAL HIGH (ref 3.87–5.11)
RDW: 12.9 % (ref 11.5–15.5)
WBC: 5.7 10*3/uL (ref 4.0–10.5)

## 2020-02-02 LAB — BASIC METABOLIC PANEL
BUN: 15 mg/dL (ref 6–23)
CO2: 28 mEq/L (ref 19–32)
Calcium: 9.4 mg/dL (ref 8.4–10.5)
Chloride: 103 mEq/L (ref 96–112)
Creatinine, Ser: 0.87 mg/dL (ref 0.40–1.20)
GFR: 68.71 mL/min (ref >60.00)
Glucose, Bld: 101 mg/dL — ABNORMAL HIGH (ref 70–99)
Potassium: 4 mEq/L (ref 3.5–5.1)
Sodium: 138 mEq/L (ref 135–145)

## 2020-02-02 NOTE — Patient Instructions (Signed)

## 2020-02-02 NOTE — Progress Notes (Signed)
Subjective:  Patient ID: Joanna Anthony, female    DOB: 1950/05/06  Age: 70 y.o. MRN: [id number]  CC: Hypertension  This visit occurred during the SARS-CoV-2 public health emergency.  Safety protocols were in place, including screening questions prior to the visit, additional usage of staff PPE, and extensive cleaning of exam room while observing appropriate contact time as indicated for disinfecting solutions.    HPI Joanna Anthony presents for f/up - She tells me her blood pressure has been well controlled.  She has the whitecoat phenomenon so the blood pressure is elevated here but at home earlier today it was 124/72.  She is tolerating the ARB well with no dizziness or lightheadedness.  She is active and denies any recent episodes of chest pain or shortness of breath.  Outpatient Medications Prior to Visit  Medication Sig Dispense Refill  . Acetaminophen 500 MG capsule     . calcium carbonate (OS-CAL) 1250 (500 Ca) MG chewable tablet Chew 1 tablet by mouth daily.    . candesartan (ATACAND) 16 MG tablet Take 1 tablet (16 mg total) by mouth daily. 90 tablet 1  . cholecalciferol (VITAMIN D3) 25 MCG (1000 UT) tablet Take 2,000 Units by mouth daily.    . Magnesium Oxide 250 MG TABS Take by mouth.    . calcium carbonate (TUMS) 500 MG chewable tablet 2 (two) times daily as needed.     No facility-administered medications prior to visit.    ROS Review of Systems  Constitutional: Negative for diaphoresis and fatigue.  HENT: Negative.   Eyes: Negative for visual disturbance.  Respiratory: Negative for cough, chest tightness, shortness of breath and wheezing.   Cardiovascular: Negative for chest pain, palpitations and leg swelling.  Gastrointestinal: Negative for abdominal pain, diarrhea, nausea and vomiting.  Endocrine: Negative.   Genitourinary: Negative.  Negative for difficulty urinating.  Musculoskeletal: Negative.  Negative for arthralgias and myalgias.  Skin: Negative.    Neurological: Negative.  Negative for dizziness and light-headedness.  Hematological: Negative for adenopathy. Does not bruise/bleed easily.  Psychiatric/Behavioral: Negative.     Objective:  BP (!) 142/88 (BP Location: Right Arm, Patient Position: Sitting, Cuff Size: Normal)   Pulse 77   Temp 98.2 F (36.8 C) (Oral)   Resp 16   Ht 5\' 7"  (1.702 m)   Wt 174 lb 6.4 oz (79.1 kg)   SpO2 99%   BMI 27.31 kg/m   BP Readings from Last 3 Encounters:  02/02/20 (!) 142/88  07/30/19 (!) 148/90  04/29/19 (!) 144/94    Wt Readings from Last 3 Encounters:  02/02/20 174 lb 6.4 oz (79.1 kg)  07/30/19 176 lb (79.8 kg)  04/29/19 171 lb 6 oz (77.7 kg)    Physical Exam Vitals reviewed.  Constitutional:      Appearance: Normal appearance.  HENT:     Nose: Nose normal.     Mouth/Throat:     Mouth: Mucous membranes are moist.  Eyes:     General: No scleral icterus.    Conjunctiva/sclera: Conjunctivae normal.  Cardiovascular:     Rate and Rhythm: Normal rate and regular rhythm.     Heart sounds: No murmur heard.   Pulmonary:     Effort: Pulmonary effort is normal.     Breath sounds: No stridor. No wheezing, rhonchi or rales.  Abdominal:     General: Abdomen is flat.     Palpations: There is no mass.     Tenderness: There is no abdominal tenderness. There is  no guarding.  Musculoskeletal:        General: Normal range of motion.     Cervical back: Neck supple.     Right lower leg: No edema.     Left lower leg: No edema.  Lymphadenopathy:     Cervical: No cervical adenopathy.  Skin:    General: Skin is warm and dry.  Neurological:     General: No focal deficit present.     Mental Status: She is alert. Mental status is at baseline.  Psychiatric:        Mood and Affect: Mood normal.        Behavior: Behavior normal.     Lab Results  Component Value Date   WBC 5.7 02/02/2020   HGB 15.0 02/02/2020   HCT 43.8 02/02/2020   PLT 229.0 02/02/2020   GLUCOSE 101 (H) 02/02/2020    CHOL 210 (H) 04/29/2019   TRIG 85.0 04/29/2019   HDL 60.40 04/29/2019   LDLDIRECT 135.7 01/12/2012   LDLCALC 133 (H) 04/29/2019   ALT 21 04/29/2019   AST 20 04/29/2019   NA 138 02/02/2020   K 4.0 02/02/2020   CL 103 02/02/2020   CREATININE 0.87 02/02/2020   BUN 15 02/02/2020   CO2 28 02/02/2020   TSH 1.25 04/29/2019   HGBA1C 5.5 01/09/2018    MM 3D SCREEN BREAST BILATERAL  Result Date: 01/22/2020 CLINICAL DATA:  Screening. EXAM: DIGITAL SCREENING BILATERAL MAMMOGRAM WITH TOMO AND CAD COMPARISON:  Previous exam(s). ACR Breast Density Category c: The breast tissue is heterogeneously dense, which may obscure small masses. FINDINGS: There are no findings suspicious for malignancy. Images were processed with CAD. IMPRESSION: No mammographic evidence of malignancy. A result letter of this screening mammogram will be mailed directly to the patient. RECOMMENDATION: Screening mammogram in one year. (Code:SM-B-01Y) BI-RADS CATEGORY  1: Negative. Electronically Signed   By: Amie Portland M.D.   On: 01/22/2020 10:44    Assessment & Plan:   Joanna Anthony was seen today for hypertension.  Diagnoses and all orders for this visit:  Essential hypertension- Based on her home blood pressure monitor she has achieved her blood pressure goal of 130/80.  Her electrolytes and renal function are normal.  Will continue the current dose of the ARB. -     CBC with Differential/Platelet; Future -     Basic metabolic panel; Future -     Basic metabolic panel -     CBC with Differential/Platelet   I am having Marko Stai maintain her cholecalciferol, calcium carbonate, Magnesium Oxide, candesartan, and Acetaminophen.  No orders of the defined types were placed in this encounter.    Follow-up: Return in about 6 months (around 08/01/2020).  Sanda Linger, MD

## 2020-04-29 ENCOUNTER — Ambulatory Visit: Payer: Medicare Other | Admitting: Internal Medicine

## 2020-08-05 ENCOUNTER — Encounter: Payer: Self-pay | Admitting: Internal Medicine

## 2020-08-05 ENCOUNTER — Other Ambulatory Visit: Payer: Self-pay

## 2020-08-05 ENCOUNTER — Ambulatory Visit (INDEPENDENT_AMBULATORY_CARE_PROVIDER_SITE_OTHER): Payer: Medicare Other | Admitting: Internal Medicine

## 2020-08-05 VITALS — BP 138/86 | HR 77 | Temp 98.2°F | Ht 67.0 in | Wt 164.0 lb

## 2020-08-05 DIAGNOSIS — E785 Hyperlipidemia, unspecified: Secondary | ICD-10-CM | POA: Diagnosis not present

## 2020-08-05 DIAGNOSIS — I10 Essential (primary) hypertension: Secondary | ICD-10-CM

## 2020-08-05 DIAGNOSIS — E2839 Other primary ovarian failure: Secondary | ICD-10-CM

## 2020-08-05 LAB — BASIC METABOLIC PANEL
BUN: 10 mg/dL (ref 6–23)
CO2: 26 mEq/L (ref 19–32)
Calcium: 9.5 mg/dL (ref 8.4–10.5)
Chloride: 100 mEq/L (ref 96–112)
Creatinine, Ser: 0.8 mg/dL (ref 0.40–1.20)
GFR: 75.72 mL/min (ref >60.00)
Glucose, Bld: 98 mg/dL (ref 70–99)
Potassium: 4 mEq/L (ref 3.5–5.1)
Sodium: 135 mEq/L (ref 135–145)

## 2020-08-05 LAB — HEPATIC FUNCTION PANEL
ALT: 16 U/L (ref 0–35)
AST: 18 U/L (ref 0–37)
Albumin: 4.4 g/dL (ref 3.5–5.2)
Alkaline Phosphatase: 61 U/L (ref 39–117)
Bilirubin, Direct: 0.1 mg/dL (ref 0.0–0.3)
Total Bilirubin: 0.6 mg/dL (ref 0.2–1.2)
Total Protein: 7.4 g/dL (ref 6.0–8.3)

## 2020-08-05 LAB — LIPID PANEL
Cholesterol: 226 mg/dL — ABNORMAL HIGH (ref 0–200)
HDL: 68.9 mg/dL (ref >39.00)
LDL Cholesterol: 137 mg/dL — ABNORMAL HIGH (ref 0–99)
NonHDL: 156.91
Total CHOL/HDL Ratio: 3
Triglycerides: 101 mg/dL (ref 0.0–149.0)
VLDL: 20.2 mg/dL (ref 0.0–40.0)

## 2020-08-05 LAB — URINALYSIS, ROUTINE W REFLEX MICROSCOPIC
Bilirubin Urine: NEGATIVE
Hgb urine dipstick: NEGATIVE
Nitrite: NEGATIVE
Specific Gravity, Urine: 1.01 (ref 1.000–1.030)
Total Protein, Urine: NEGATIVE
Urine Glucose: NEGATIVE
Urobilinogen, UA: 0.2 (ref 0.0–1.0)
pH: 6 (ref 5.0–8.0)

## 2020-08-05 LAB — CBC WITH DIFFERENTIAL/PLATELET
Basophils Absolute: 0 10*3/uL (ref 0.0–0.1)
Basophils Relative: 0.8 % (ref 0.0–3.0)
Eosinophils Absolute: 0.2 10*3/uL (ref 0.0–0.7)
Eosinophils Relative: 5.2 % — ABNORMAL HIGH (ref 0.0–5.0)
HCT: 42 % (ref 36.0–46.0)
Hemoglobin: 14.4 g/dL (ref 12.0–15.0)
Lymphocytes Relative: 39.8 % (ref 12.0–46.0)
Lymphs Abs: 1.7 10*3/uL (ref 0.7–4.0)
MCHC: 34.4 g/dL (ref 30.0–36.0)
MCV: 82.7 fl (ref 78.0–100.0)
Monocytes Absolute: 0.4 10*3/uL (ref 0.1–1.0)
Monocytes Relative: 8.3 % (ref 3.0–12.0)
Neutro Abs: 1.9 10*3/uL (ref 1.4–7.7)
Neutrophils Relative %: 45.9 % (ref 43.0–77.0)
Platelets: 209 10*3/uL (ref 150.0–400.0)
RBC: 5.08 Mil/uL (ref 3.87–5.11)
RDW: 14 % (ref 11.5–15.5)
WBC: 4.2 10*3/uL (ref 4.0–10.5)

## 2020-08-05 LAB — TSH: TSH: 1.79 u[IU]/mL (ref 0.35–5.50)

## 2020-08-05 NOTE — Patient Instructions (Signed)

## 2020-08-05 NOTE — Progress Notes (Signed)
Subjective:  Patient ID: Joanna Anthony, female    DOB: Dec 02, 1950  Age: 70 y.o. MRN: [id number]  CC: Hypertension and Hyperlipidemia  This visit occurred during the SARS-CoV-2 public health emergency.  Safety protocols were in place, including screening questions prior to the visit, additional usage of staff PPE, and extensive cleaning of exam room while observing appropriate contact time as indicated for disinfecting solutions.    HPI Joanna Anthony presents for f/up -   She has intentionally lost weight with lifestyle modifications.  She walks about 30 minutes a day and does not experience chest pain, shortness of breath, diaphoresis, dizziness, or lightheadedness.  She tells me her blood pressure has been well controlled.  Outpatient Medications Prior to Visit  Medication Sig Dispense Refill   Acetaminophen 500 MG capsule      calcium carbonate (OS-CAL) 1250 (500 Ca) MG chewable tablet Chew 1 tablet by mouth daily.     candesartan (ATACAND) 16 MG tablet Take 1 tablet (16 mg total) by mouth daily. 90 tablet 1   cholecalciferol (VITAMIN D3) 25 MCG (1000 UT) tablet Take 2,000 Units by mouth daily.     Magnesium Oxide 250 MG TABS Take by mouth.     No facility-administered medications prior to visit.    ROS Review of Systems  Constitutional:  Negative for diaphoresis and fatigue.  HENT: Negative.    Eyes: Negative.   Respiratory:  Negative for cough, chest tightness, shortness of breath and wheezing.   Cardiovascular:  Negative for chest pain, palpitations and leg swelling.  Gastrointestinal:  Negative for abdominal pain, constipation, diarrhea and nausea.  Endocrine: Negative.   Genitourinary: Negative.  Negative for difficulty urinating, dysuria, flank pain, frequency and hematuria.  Musculoskeletal: Negative.   Skin: Negative.  Negative for color change.  Neurological: Negative.  Negative for dizziness, weakness and light-headedness.  Hematological:  Negative for  adenopathy. Does not bruise/bleed easily.  Psychiatric/Behavioral: Negative.     Objective:  BP 138/86 (BP Location: Left Arm, Patient Position: Sitting, Cuff Size: Large)   Pulse 77   Temp 98.2 F (36.8 C) (Oral)   Ht 5\' 7"  (1.702 m)   Wt 164 lb (74.4 kg)   SpO2 97%   BMI 25.69 kg/m   BP Readings from Last 3 Encounters:  08/05/20 138/86  02/02/20 (!) 142/88  07/30/19 (!) 148/90    Wt Readings from Last 3 Encounters:  08/05/20 164 lb (74.4 kg)  02/02/20 174 lb 6.4 oz (79.1 kg)  07/30/19 176 lb (79.8 kg)    Physical Exam Vitals reviewed.  HENT:     Nose: Nose normal.     Mouth/Throat:     Mouth: Mucous membranes are moist.  Eyes:     General: No scleral icterus.    Conjunctiva/sclera: Conjunctivae normal.  Cardiovascular:     Rate and Rhythm: Normal rate and regular rhythm.     Heart sounds: No murmur heard. Pulmonary:     Effort: Pulmonary effort is normal.     Breath sounds: No stridor. No wheezing, rhonchi or rales.  Abdominal:     General: Abdomen is flat. Bowel sounds are normal. There is no distension.     Palpations: Abdomen is soft. There is no hepatomegaly, splenomegaly or mass.  Musculoskeletal:        General: Normal range of motion.     Cervical back: Neck supple.     Right lower leg: No edema.     Left lower leg: No edema.  Lymphadenopathy:  Cervical: No cervical adenopathy.  Skin:    General: Skin is warm and dry.  Neurological:     General: No focal deficit present.     Mental Status: She is alert.  Psychiatric:        Mood and Affect: Mood normal.        Behavior: Behavior normal.    Lab Results  Component Value Date   WBC 4.2 08/05/2020   HGB 14.4 08/05/2020   HCT 42.0 08/05/2020   PLT 209.0 08/05/2020   GLUCOSE 98 08/05/2020   CHOL 226 (H) 08/05/2020   TRIG 101.0 08/05/2020   HDL 68.90 08/05/2020   LDLDIRECT 135.7 01/12/2012   LDLCALC 137 (H) 08/05/2020   ALT 16 08/05/2020   AST 18 08/05/2020   NA 135 08/05/2020   K 4.0  08/05/2020   CL 100 08/05/2020   CREATININE 0.80 08/05/2020   BUN 10 08/05/2020   CO2 26 08/05/2020   TSH 1.79 08/05/2020   HGBA1C 5.5 01/09/2018    MM 3D SCREEN BREAST BILATERAL  Result Date: 01/22/2020 CLINICAL DATA:  Screening. EXAM: DIGITAL SCREENING BILATERAL MAMMOGRAM WITH TOMO AND CAD COMPARISON:  Previous exam(s). ACR Breast Density Category c: The breast tissue is heterogeneously dense, which may obscure small masses. FINDINGS: There are no findings suspicious for malignancy. Images were processed with CAD. IMPRESSION: No mammographic evidence of malignancy. A result letter of this screening mammogram will be mailed directly to the patient. RECOMMENDATION: Screening mammogram in one year. (Code:SM-B-01Y) BI-RADS CATEGORY  1: Negative. Electronically Signed   By: Lajean Manes M.D.   On: 01/22/2020 10:44    Assessment & Plan:   Joanna Anthony was seen today for hypertension and hyperlipidemia.  Diagnoses and all orders for this visit:  Hyperlipidemia LDL goal <130- Statin therapy is not indicated. -     Lipid panel; Future -     TSH; Future -     Hepatic function panel; Future -     Hepatic function panel -     TSH -     Lipid panel  Essential hypertension- Her blood pressure is adequately well controlled.  Electrolytes and renal function are normal.  She has asymptomatic bacteriuria. -     CBC with Differential/Platelet; Future -     Basic metabolic panel; Future -     TSH; Future -     Urinalysis, Routine w reflex microscopic; Future -     Hepatic function panel; Future -     Hepatic function panel -     Urinalysis, Routine w reflex microscopic -     TSH -     Basic metabolic panel -     CBC with Differential/Platelet  Estrogen deficiency -     DG Bone Density; Future  I am having Joanna Anthony maintain her cholecalciferol, calcium carbonate, Magnesium Oxide, candesartan, and Acetaminophen.  No orders of the defined types were placed in this  encounter.    Follow-up: Return in about 6 months (around 02/05/2021).  Joanna Calico, MD

## 2020-08-10 ENCOUNTER — Other Ambulatory Visit: Payer: Self-pay | Admitting: Internal Medicine

## 2020-08-10 DIAGNOSIS — I10 Essential (primary) hypertension: Secondary | ICD-10-CM

## 2020-08-10 MED ORDER — CANDESARTAN CILEXETIL 16 MG PO TABS: 16.0000 mg | ORAL_TABLET | Freq: Every day | ORAL | 1 refills | Status: DC

## 2020-08-18 ENCOUNTER — Encounter: Payer: Self-pay | Admitting: Internal Medicine

## 2020-08-19 ENCOUNTER — Other Ambulatory Visit: Payer: Self-pay | Admitting: Internal Medicine

## 2020-08-19 DIAGNOSIS — J208 Acute bronchitis due to other specified organisms: Secondary | ICD-10-CM | POA: Insufficient documentation

## 2020-08-19 DIAGNOSIS — U071 COVID-19: Secondary | ICD-10-CM

## 2020-08-19 MED ORDER — PROMETHAZINE-DM 6.25-15 MG/5ML PO SYRP: 5.0000 mL | ORAL_SOLUTION | Freq: Four times a day (QID) | ORAL | 0 refills | Status: DC | PRN

## 2020-08-19 MED ORDER — PAXLOVID 20 X 150 MG & 10 X 100MG PO TBPK: 3.0000 | ORAL_TABLET | Freq: Two times a day (BID) | ORAL | 0 refills | Status: AC

## 2020-08-19 NOTE — Progress Notes (Unsigned)
[name]

## 2020-10-06 ENCOUNTER — Telehealth: Payer: Self-pay | Admitting: Internal Medicine

## 2020-10-06 NOTE — Telephone Encounter (Signed)
Left message for patient to call me back at [phone] to schedule Medicare Annual Wellness Visit   No hx of AWV eligible as of 03/02/18  Please schedule at anytime with [facility] Advisor if patient calls the office back.    40 Minutes appointment   Any questions, please call me at [phone]

## 2020-11-17 ENCOUNTER — Encounter: Payer: Self-pay | Admitting: Internal Medicine

## 2020-12-06 ENCOUNTER — Encounter: Payer: Self-pay | Admitting: Internal Medicine

## 2020-12-06 ENCOUNTER — Other Ambulatory Visit: Payer: Self-pay | Admitting: Internal Medicine

## 2020-12-06 DIAGNOSIS — Z1231 Encounter for screening mammogram for malignant neoplasm of breast: Secondary | ICD-10-CM

## 2021-01-20 ENCOUNTER — Ambulatory Visit
Admission: RE | Admit: 2021-01-20 | Discharge: 2021-01-20 | Disposition: A | Discharge status: Home / Self Care | Payer: Medicare Other | Source: Ambulatory Visit | Attending: Internal Medicine | Admitting: Internal Medicine

## 2021-01-20 DIAGNOSIS — Z1231 Encounter for screening mammogram for malignant neoplasm of breast: Secondary | ICD-10-CM

## 2021-02-03 ENCOUNTER — Ambulatory Visit
Admission: RE | Admit: 2021-02-03 | Discharge: 2021-02-03 | Disposition: A | Discharge status: Home / Self Care | Payer: Medicare Other | Source: Ambulatory Visit | Attending: Internal Medicine | Admitting: Internal Medicine

## 2021-02-03 DIAGNOSIS — E2839 Other primary ovarian failure: Secondary | ICD-10-CM

## 2021-02-03 LAB — HM DEXA SCAN: HM Dexa Scan: NORMAL

## 2021-02-07 ENCOUNTER — Ambulatory Visit (INDEPENDENT_AMBULATORY_CARE_PROVIDER_SITE_OTHER): Payer: Medicare Other | Admitting: Internal Medicine

## 2021-02-07 ENCOUNTER — Encounter: Payer: Self-pay | Admitting: Internal Medicine

## 2021-02-07 ENCOUNTER — Other Ambulatory Visit: Payer: Self-pay

## 2021-02-07 VITALS — BP 134/76 | HR 67 | Temp 98.0°F | Ht 67.0 in | Wt 170.0 lb

## 2021-02-07 DIAGNOSIS — Z23 Encounter for immunization: Secondary | ICD-10-CM

## 2021-02-07 DIAGNOSIS — I10 Essential (primary) hypertension: Secondary | ICD-10-CM

## 2021-02-07 LAB — BASIC METABOLIC PANEL
BUN: 13 mg/dL (ref 6–23)
CO2: 26 mEq/L (ref 19–32)
Calcium: 9.2 mg/dL (ref 8.4–10.5)
Chloride: 99 mEq/L (ref 96–112)
Creatinine, Ser: 0.84 mg/dL (ref 0.40–1.20)
GFR: 71.16 mL/min (ref >60.00)
Glucose, Bld: 93 mg/dL (ref 70–99)
Potassium: 4.3 mEq/L (ref 3.5–5.1)
Sodium: 133 mEq/L — ABNORMAL LOW (ref 135–145)

## 2021-02-07 NOTE — Patient Instructions (Signed)

## 2021-02-07 NOTE — Progress Notes (Signed)
Subjective:  Patient ID: Joanna Anthony, female    DOB: [birth year]  Age: 71 y.o. MRN: [id number]  CC: Hypertension  This visit occurred during the SARS-CoV-2 public health emergency.  Safety protocols were in place, including screening questions prior to the visit, additional usage of staff PPE, and extensive cleaning of exam room while observing appropriate contact time as indicated for disinfecting solutions.    HPI Joanna Anthony presents for f/up-   She tells me that her blood pressure has been well controlled.  She denies headache, blurred vision, chest pain, shortness of breath, dyspnea on exertion, or edema.  Outpatient Medications Prior to Visit  Medication Sig Dispense Refill   Acetaminophen 500 MG capsule      calcium carbonate (OS-CAL) 1250 (500 Ca) MG chewable tablet Chew 1 tablet by mouth daily.     candesartan (ATACAND) 16 MG tablet Take 1 tablet (16 mg total) by mouth daily. 90 tablet 1   cholecalciferol (VITAMIN D3) 25 MCG (1000 UT) tablet Take 2,000 Units by mouth daily.     Magnesium Oxide 250 MG TABS Take by mouth.     promethazine-dextromethorphan (PROMETHAZINE-DM) 6.25-15 MG/5ML syrup Take 5 mLs by mouth 4 (four) times daily as needed for cough. 118 mL 0   No facility-administered medications prior to visit.    ROS Review of Systems  Constitutional:  Negative for diaphoresis and fatigue.  HENT: Negative.    Eyes: Negative.   Respiratory:  Negative for cough, chest tightness, shortness of breath and wheezing.   Cardiovascular:  Negative for chest pain, palpitations and leg swelling.  Gastrointestinal:  Negative for abdominal pain, diarrhea, nausea and vomiting.  Endocrine: Negative.   Genitourinary: Negative.  Negative for difficulty urinating.  Musculoskeletal: Negative.  Negative for arthralgias.  Skin: Negative.   Neurological:  Negative for dizziness, weakness, numbness and headaches.  Hematological:  Negative for adenopathy. Does not bruise/bleed  easily.  Psychiatric/Behavioral: Negative.     Objective:  BP 134/76 (BP Location: Right Arm, Patient Position: Sitting, Cuff Size: Large)    Pulse 67    Temp 98 F (36.7 C) (Oral)    Ht 5\' 7"  (1.702 m)    Wt 170 lb (77.1 kg)    SpO2 97%    BMI 26.63 kg/m   BP Readings from Last 3 Encounters:  02/07/21 134/76  02/07/21 134/76  08/05/20 138/86    Wt Readings from Last 3 Encounters:  02/07/21 170 lb (77.1 kg)  02/07/21 170 lb (77.1 kg)  08/05/20 164 lb (74.4 kg)    Physical Exam Vitals reviewed.  Constitutional:      Appearance: Normal appearance.  HENT:     Mouth/Throat:     Mouth: Mucous membranes are moist.  Eyes:     General: No scleral icterus.    Conjunctiva/sclera: Conjunctivae normal.  Cardiovascular:     Rate and Rhythm: Normal rate and regular rhythm.     Heart sounds: No murmur heard. Pulmonary:     Effort: Pulmonary effort is normal.     Breath sounds: No stridor. No wheezing, rhonchi or rales.  Abdominal:     General: Abdomen is flat.     Palpations: There is no mass.     Tenderness: There is no abdominal tenderness. There is no guarding.     Hernia: No hernia is present.  Musculoskeletal:     Cervical back: Neck supple.     Right lower leg: No edema.     Left lower leg: No edema.  Lymphadenopathy:     Cervical: No cervical adenopathy.  Skin:    General: Skin is warm and dry.     Coloration: Skin is not pale.  Neurological:     General: No focal deficit present.     Mental Status: Mental status is at baseline.  Psychiatric:        Mood and Affect: Mood normal.    Lab Results  Component Value Date   WBC 4.2 08/05/2020   HGB 14.4 08/05/2020   HCT 42.0 08/05/2020   PLT 209.0 08/05/2020   GLUCOSE 93 02/07/2021   CHOL 226 (H) 08/05/2020   TRIG 101.0 08/05/2020   HDL 68.90 08/05/2020   LDLDIRECT 135.7 01/12/2012   LDLCALC 137 (H) 08/05/2020   ALT 16 08/05/2020   AST 18 08/05/2020   NA 133 (L) 02/07/2021   K 4.3 02/07/2021   CL 99  02/07/2021   CREATININE 0.84 02/07/2021   BUN 13 02/07/2021   CO2 26 02/07/2021   TSH 1.79 08/05/2020   HGBA1C 5.5 01/09/2018    DG Bone Density  Result Date: 02/03/2021 EXAM: DUAL X-RAY ABSORPTIOMETRY (DXA) FOR BONE MINERAL DENSITY IMPRESSION: Referring Physician:  Janith Lima Your patient completed a bone mineral density test using GE Lunar iDXA system (analysis version: 16). Technologist: [place] PATIENT: Name: Joanna Anthony, Joanna Anthony Patient ID: [id number] Birth Date: [birth year] Height: 66.0 in. Sex: Female Measured: 02/03/2021 Weight: 169.8 lbs. Indications: Caucasian, Estrogen Deficient, History of Fracture (Adult) (V15.51), Hysterectomy, Left Ovariectomy, Multiple broken bones, Postmenopausal Fractures: Left Foot, Right Ankle, Shoulder, Toe Treatments: Calcium (E943.0), Vitamin D (E933.5) ASSESSMENT: The BMD measured at Femur Neck Left is 0.933 g/cm2 with a T-score of -0.8. This patient is considered normal according to [facility]) criteria. The quality of the exam is good. The lumbar spine was excluded due to degenerative changes. Site Region Measured Date Measured Age YA BMD Significant CHANGE T-score DualFemur Neck Left 02/03/2021 68.8 -0.8 0.933 g/cm2 * DualFemur Neck Left 12/04/2016 64.7 -1.3 0.860 g/cm2 DualFemur Total Mean 02/03/2021 68.8 0.6 1.089 g/cm2 DualFemur Total Mean 12/04/2016 64.7 0.5 1.072 g/cm2 Left Forearm Radius 33% 02/03/2021 68.8 -0.7 0.818 g/cm2 World Health Organization [facility]) criteria for post-menopausal, Caucasian Women: Normal       T-score at or above -1 SD Osteopenia   T-score between -1 and -2.5 SD Osteoporosis T-score at or below -2.5 SD RECOMMENDATION: 1. All patients should optimize calcium and vitamin D intake. 2. Consider FDA-approved medical therapies in postmenopausal women and men aged 70 years and older, based on the following: a. A hip or vertebral (clinical or morphometric) fracture. b. T-score = -2.5 at the femoral neck or spine after appropriate  evaluation to exclude secondary causes. c. Low bone mass (T-score between -1.0 and -2.5 at the femoral neck or spine) and a 10-year probability of a hip fracture = 3% or a 10-year probability of a major osteoporosis-related fracture = 20% based on the [place]-adapted WHO algorithm. d. Clinician judgment and/or patient preferences may indicate treatment for people with 10-year fracture probabilities above or below these levels. FOLLOW-UP: Patients with diagnosis of osteoporosis or at high risk for fracture should have regular bone mineral density tests.? Patients eligible for Medicare are allowed routine testing every 2 years.? The testing frequency can be increased to one year for patients who have rapidly progressing disease, are receiving or discontinuing medical therapy to restore bone mass, or have additional risk factors. I have reviewed this study and agree with the findings. Mark A. Thornton Papas,  M.D. [facility] Electronically Signed   By: Lavonia Dana M.D.   On: 02/03/2021 08:31    Assessment & Plan:   Joanna Anthony was seen today for hypertension.  Diagnoses and all orders for this visit:  Essential hypertension- Her blood pressure is adequately well controlled.  Electrolytes and renal function are okay.  Will continue the current dose of the ARB. -     Basic metabolic panel; Future -     Basic metabolic panel   I have discontinued Joanna Anthony. Joanna Anthony's promethazine-dextromethorphan. I am also having her start on Shingrix. Additionally, I am having her maintain her cholecalciferol, calcium carbonate, Magnesium Oxide, Acetaminophen, and candesartan.  Meds ordered this encounter  Medications   Zoster Vaccine Adjuvanted [facility]) injection    Sig: Inject 0.5 mLs into the muscle once for 1 dose.    Dispense:  0.5 mL    Refill:  1     Follow-up: Return in about 6 months (around 08/07/2021).  Scarlette Calico, MD

## 2021-02-07 NOTE — Progress Notes (Signed)
This encounter was created in error - please disregard.

## 2021-02-08 NOTE — Progress Notes (Unsigned)
Disregard   Patient left before seeing [facility] for AWV

## 2021-02-10 IMAGING — MG MM DIGITAL SCREENING BILAT W/ TOMO W/ CAD
8 series · 8 of 24 positions shown · non-contrast
Comparison: Previous exam(s).

CLINICAL DATA: Screening.

EXAM:
DIGITAL SCREENING BILATERAL MAMMOGRAM WITH TOMO AND CAD

[R MLO synth-2D]
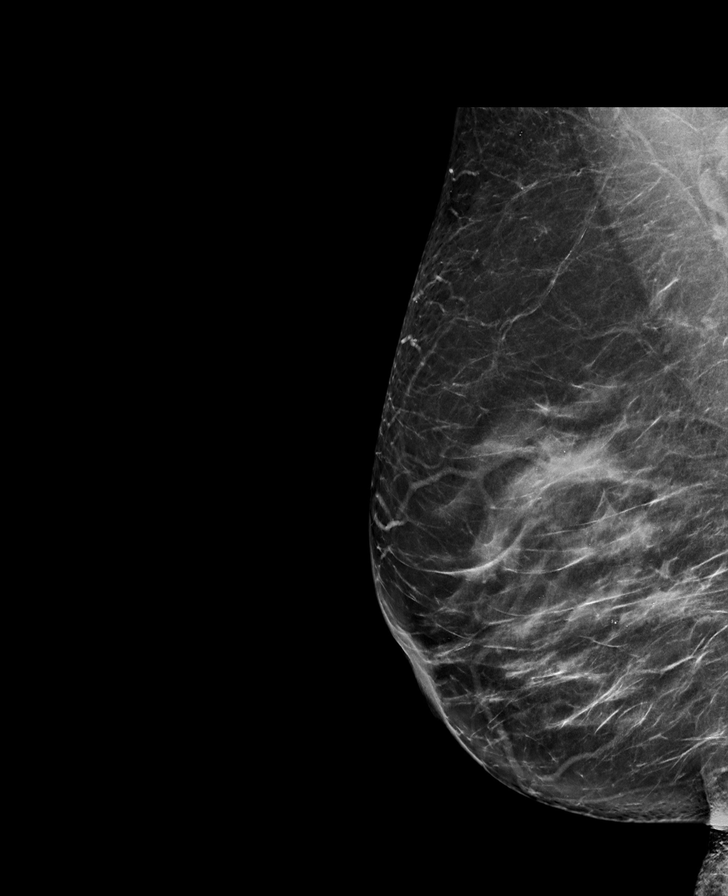

[L CC synth-2D]
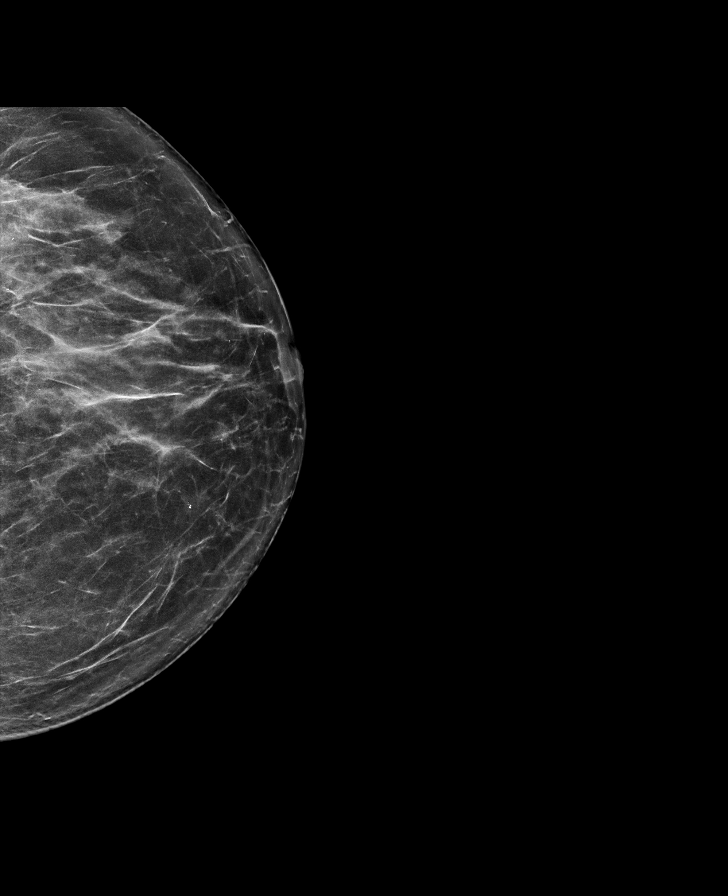

[L MLO synth-2D]
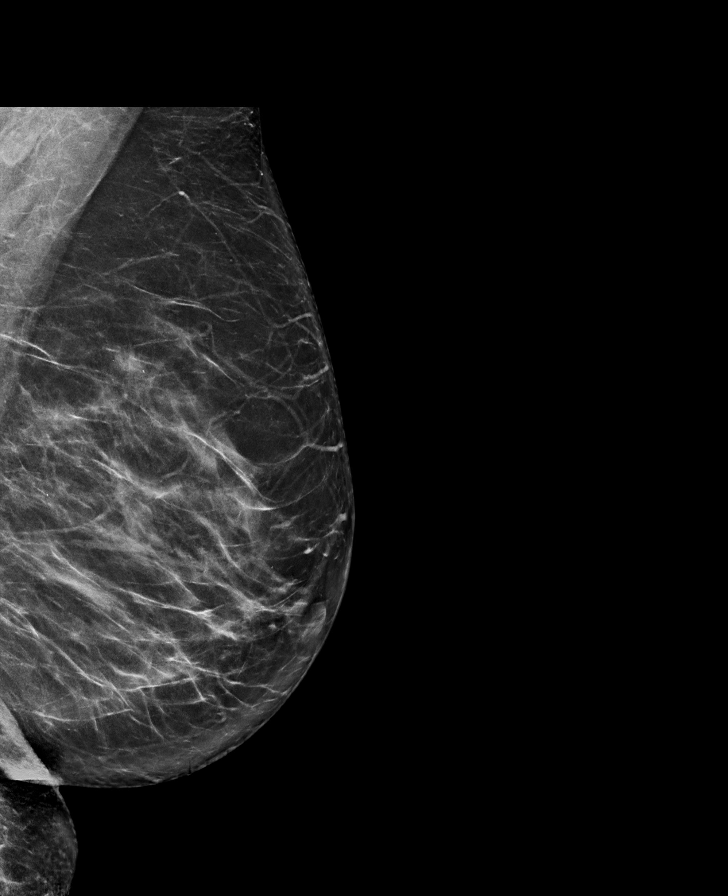

[R CC synth-2D]
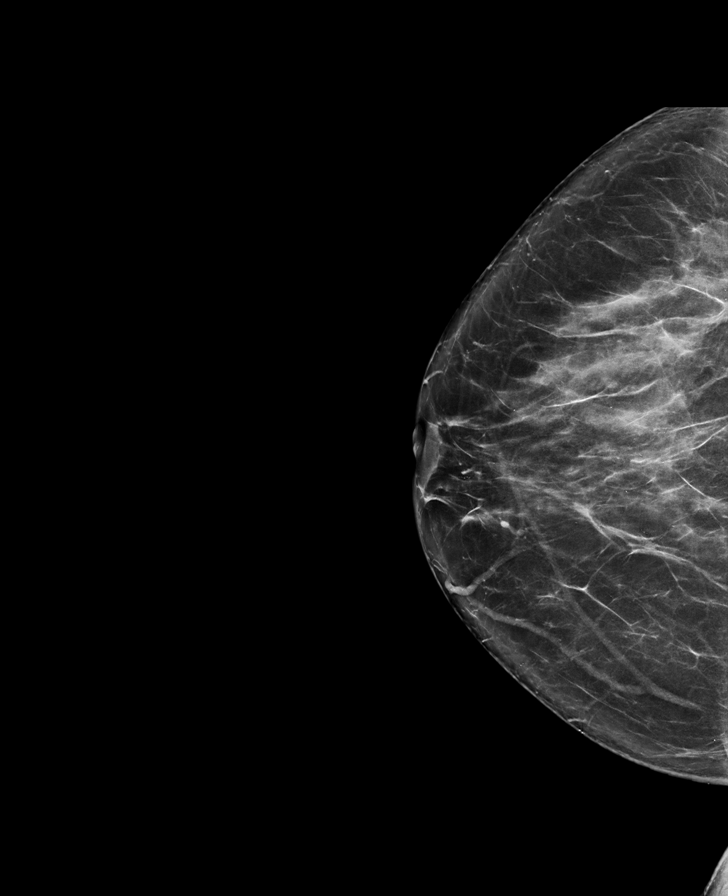

[R CC tomo · tomo slice 41/80.0]
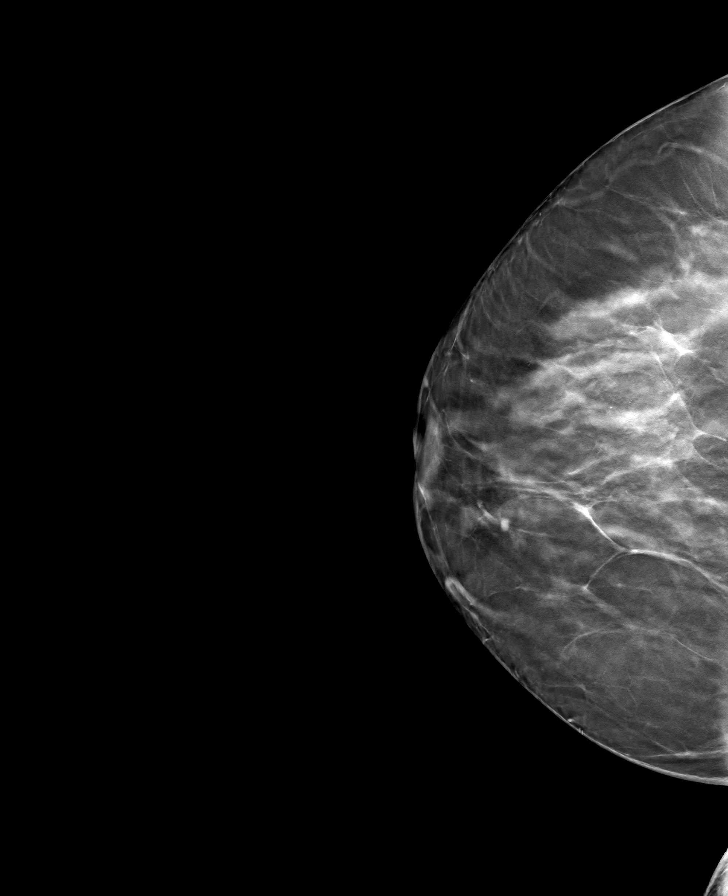

[L MLO tomo · tomo slice 41/82.0]
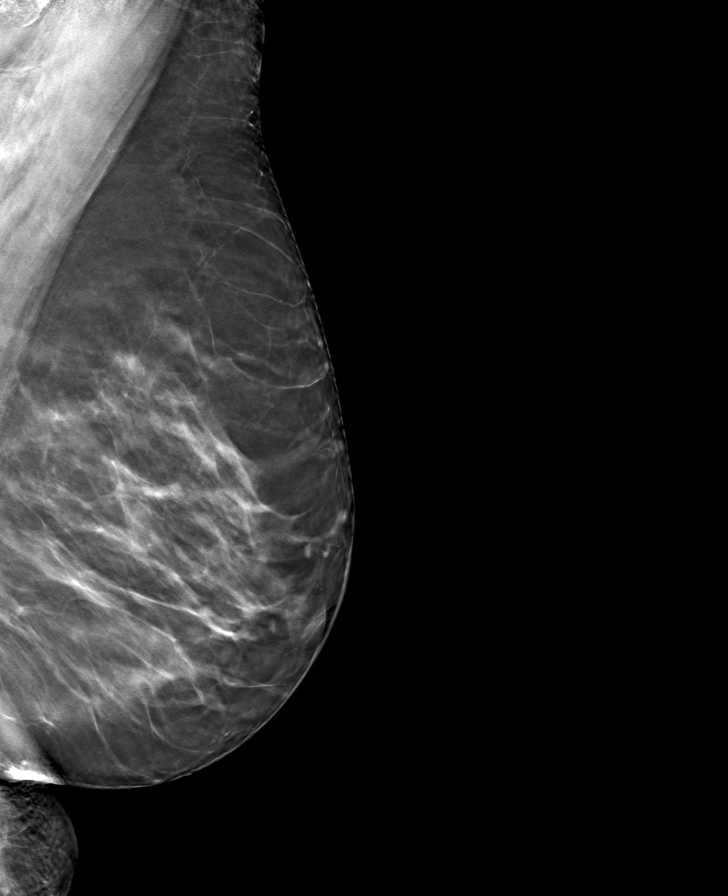

[L CC tomo · tomo slice 37/74.0]
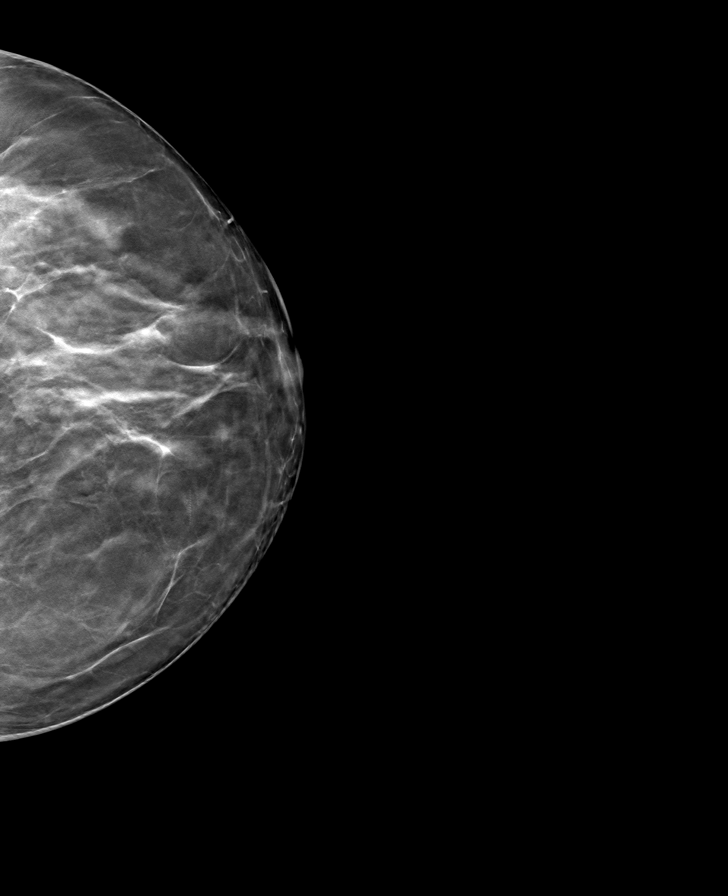

[R MLO tomo · tomo slice 42/83.0]
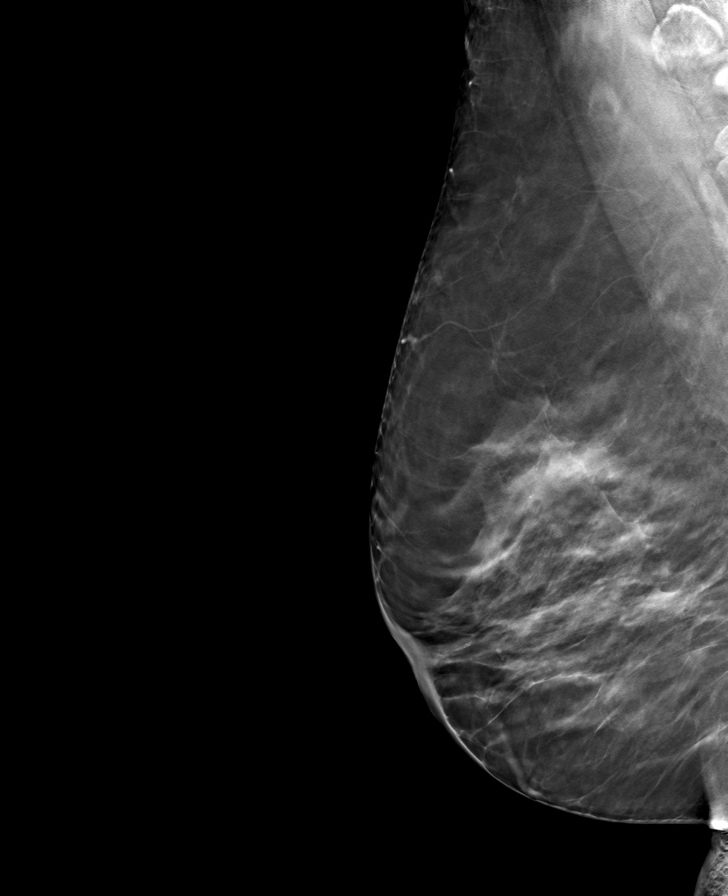

[8 of 24 positions shown; findings below may reference images not displayed]

ACR Breast Density Category c: The breast tissue is heterogeneously
dense, which may obscure small masses.
FINDINGS: In the right breast, a possible asymmetry warrants further
evaluation. In the left breast, no findings suspicious for
malignancy. Images were processed with CAD.
IMPRESSION: Further evaluation is suggested for possible asymmetry in the right
breast.

RECOMMENDATION:
Diagnostic mammogram and possibly ultrasound of the right breast.
(Code:[id number])

The patient will be contacted regarding the findings, and additional
imaging will be scheduled.

BI-RADS CATEGORY  0: Incomplete. Need additional imaging evaluation
and/or prior mammograms for comparison.

## 2021-02-11 ENCOUNTER — Other Ambulatory Visit: Payer: Self-pay | Admitting: Internal Medicine

## 2021-02-11 ENCOUNTER — Encounter: Payer: Self-pay | Admitting: Internal Medicine

## 2021-02-11 DIAGNOSIS — F4322 Adjustment disorder with anxiety: Secondary | ICD-10-CM | POA: Insufficient documentation

## 2021-02-11 MED ORDER — DIAZEPAM 2 MG PO TABS: 2.0000 mg | ORAL_TABLET | Freq: Four times a day (QID) | ORAL | 0 refills | Status: DC | PRN

## 2021-02-12 MED ORDER — SHINGRIX 50 MCG/0.5ML IM SUSR: 0.5000 mL | Freq: Once | INTRAMUSCULAR | 1 refills | Status: AC

## 2021-04-07 ENCOUNTER — Encounter: Payer: Self-pay | Admitting: Internal Medicine

## 2021-04-08 ENCOUNTER — Other Ambulatory Visit: Payer: Self-pay | Admitting: Internal Medicine

## 2021-04-08 DIAGNOSIS — I10 Essential (primary) hypertension: Secondary | ICD-10-CM

## 2021-04-08 MED ORDER — CANDESARTAN CILEXETIL 16 MG PO TABS: 16.0000 mg | ORAL_TABLET | Freq: Every day | ORAL | 1 refills | Status: DC

## 2021-09-07 ENCOUNTER — Encounter: Payer: Self-pay | Admitting: Gastroenterology

## 2021-10-05 ENCOUNTER — Ambulatory Visit (AMBULATORY_SURGERY_CENTER): Payer: Self-pay | Admitting: *Deleted

## 2021-10-05 VITALS — Ht 67.0 in | Wt 162.2 lb

## 2021-10-05 DIAGNOSIS — Z86010 Personal history of colonic polyps: Secondary | ICD-10-CM

## 2021-10-05 DIAGNOSIS — Z800 Family history of malignant neoplasm of digestive organs: Secondary | ICD-10-CM

## 2021-10-05 MED ORDER — NA SULFATE-K SULFATE-MG SULF 17.5-3.13-1.6 GM/177ML PO SOLN: 1.0000 | Freq: Once | ORAL | 0 refills | Status: AC

## 2021-10-05 NOTE — Progress Notes (Signed)
No egg or soy allergy known to patient  No issues known to pt with past sedation with any surgeries or procedures Patient denies ever being told they had issues or difficulty with intubation  No FH of Malignant Hyperthermia Pt is not on diet pills Pt is not on home 02  Pt is not on blood thinners  Pt denies issues with constipation  No A fib or A flutter Have any cardiac testing pending--NO Pt instructed to use Singlecare.com or GoodRx for a price reduction on prep   

## 2021-10-13 ENCOUNTER — Other Ambulatory Visit: Payer: Self-pay | Admitting: Internal Medicine

## 2021-10-13 DIAGNOSIS — I10 Essential (primary) hypertension: Secondary | ICD-10-CM

## 2021-10-31 ENCOUNTER — Other Ambulatory Visit: Payer: Self-pay | Admitting: Internal Medicine

## 2021-10-31 DIAGNOSIS — I10 Essential (primary) hypertension: Secondary | ICD-10-CM

## 2021-11-01 MED ORDER — CANDESARTAN CILEXETIL 16 MG PO TABS: 16.0000 mg | ORAL_TABLET | Freq: Every day | ORAL | 0 refills | Status: DC

## 2021-11-03 ENCOUNTER — Ambulatory Visit: Payer: Medicare Other | Admitting: Internal Medicine

## 2021-11-04 ENCOUNTER — Encounter: Payer: Self-pay | Admitting: Gastroenterology

## 2021-11-04 ENCOUNTER — Ambulatory Visit (AMBULATORY_SURGERY_CENTER): Payer: Medicare Other | Admitting: Gastroenterology

## 2021-11-04 VITALS — BP 121/67 | HR 58 | Temp 97.7°F | Resp 19 | Ht 67.0 in | Wt 162.2 lb

## 2021-11-04 DIAGNOSIS — Z09 Encounter for follow-up examination after completed treatment for conditions other than malignant neoplasm: Secondary | ICD-10-CM

## 2021-11-04 DIAGNOSIS — Z86010 Personal history of colonic polyps: Secondary | ICD-10-CM

## 2021-11-04 DIAGNOSIS — Z800 Family history of malignant neoplasm of digestive organs: Secondary | ICD-10-CM | POA: Diagnosis not present

## 2021-11-04 DIAGNOSIS — K635 Polyp of colon: Secondary | ICD-10-CM

## 2021-11-04 DIAGNOSIS — D122 Benign neoplasm of ascending colon: Secondary | ICD-10-CM

## 2021-11-04 MED ORDER — SODIUM CHLORIDE 0.9 % IV SOLN: 500.0000 mL | Freq: Once | INTRAVENOUS | Status: DC

## 2021-11-04 NOTE — Progress Notes (Signed)
Referring Provider: Janith Lima, MD Primary Care Physician:  Janith Lima, MD  Indication for Procedure:  Colon cancer Surveillance   IMPRESSION:  Need for colon cancer surveillance Appropriate candidate for monitored anesthesia care  PLAN: Colonoscopy in the [place] today   HPI: Joanna Anthony is a 71 y.o. female presents for surveillance colonoscopy.  Prior endoscopic history: Normal colonoscopy in 2002 and 2009.  Hyperplastic polyp removed on colonoscopy 2015. 2020: - Non-bleeding internal hemorrhoids. Five tubular adenomas and SSPs removed.    No baseline GI symptoms.   Mother with colon cancer at age 17. Brother and sister with colon polyps. No other known family history of colon cancer or polyps. No family history of uterine/endometrial cancer, pancreatic cancer or gastric/stomach cancer.   Past Medical History:  Diagnosis Date   Allergy    Anemia    past hx    Arthritis    HIPS,LEFT KNEE   Blood transfusion without reported diagnosis    1 unit with hysterectomy    Cataract    bilateral early stages   GERD (gastroesophageal reflux disease)    Hypertension    Migraine headache    Osteopenia    Skin cancer    hx of BCC/ neck and right arm,shoulder    Past Surgical History:  Procedure Laterality Date   ABDOMINAL HYSTERECTOMY     COLONOSCOPY     Josph Macho procedure     OOPHORECTOMY     POLYPECTOMY     TONSILLECTOMY     TUBAL LIGATION      Current Outpatient Medications  Medication Sig Dispense Refill   Acetaminophen 500 MG capsule as needed.     Biotin 1 MG CAPS Take 1 tablet by mouth daily.     candesartan (ATACAND) 16 MG tablet Take 1 tablet (16 mg total) by mouth daily. 90 tablet 0   cholecalciferol (VITAMIN D3) 25 MCG (1000 UT) tablet Take 2,000 Units by mouth daily.     Multiple Vitamins-Minerals (ZINC PO) Take by mouth daily. TAKE ONE DAILY     Calcium Carbonate Antacid (TUMS PO) TAKE 2 CHEWABLES FOR HEARTBURN AS NEEDED     Magnesium  Oxide 250 MG TABS Take by mouth.     Current Facility-Administered Medications  Medication Dose Route Frequency Provider Last Rate Last Admin   0.9 %  sodium chloride infusion  500 mL Intravenous Once Thornton Park, MD        Allergies as of 11/04/2021 - Review Complete 11/04/2021  Allergen Reaction Noted   Meperidine hcl  12/15/2014   Morphine  11/06/2007   Tetracycline Rash 11/06/2007    Family History  Problem Relation Age of Onset   Colon cancer Mother    Kidney disease Father    Bladder Cancer Father    Hypertension Sister    Breast cancer Sister 48   Colon polyps Sister    Colon polyps Sister    Colon polyps Brother    Stomach cancer Maternal Grandmother    Kidney disease Other    Hypertension Other    Colon cancer Other        Colon Cancer 1st degree relative<60   Heart disease Neg Hx    Early death Neg Hx    Diabetes Neg Hx    Alcohol abuse Neg Hx    Hyperlipidemia Neg Hx    Stroke Neg Hx    Esophageal cancer Neg Hx    Rectal cancer Neg Hx    Crohn's disease Neg  Hx      Physical Exam: General:   Alert,  well-nourished, pleasant and cooperative in NAD Head:  Normocephalic and atraumatic. Eyes:  Sclera clear, no icterus.   Conjunctiva pink. Mouth:  No deformity or lesions.   Neck:  Supple; no masses or thyromegaly. Lungs:  Clear throughout to auscultation.   No wheezes. Heart:  Regular rate and rhythm; no murmurs. Abdomen:  Soft, non-tender, nondistended, normal bowel sounds, no rebound or guarding.  Msk:  Symmetrical. No boney deformities LAD: No inguinal or umbilical LAD Extremities:  No clubbing or edema. Neurologic:  Alert and  oriented x4;  grossly nonfocal Skin:  No obvious rash or bruise. Psych:  Alert and cooperative. Normal mood and affect.     Studies/Results: No results found.    Kristopher Delk L. Tarri Glenn, MD, MPH 11/04/2021, 10:31 AM

## 2021-11-04 NOTE — Op Note (Signed)
[facility] Patient Name: Joanna Anthony Procedure Date: 11/04/2021 10:32 AM MRN: [id number] Endoscopist: Thornton Park MD, MD Age: 71 Referring MD:  Date of Birth: 20-Dec-1950 Gender: Female Account #: [id number] Procedure:                Colonoscopy Indications:              Surveillance: Personal history of adenomatous                            polyps on last colonoscopy 3 years ago                           Normal colonoscopy 2002 and 2009                           Hyperplastic polyp removed on colonoscopy 2015.                           Five tubular adenomas and SSPs removed on                            colonoscopy 2020                           Family history of colon cancer (mother) Medicines:                Monitored Anesthesia Care Procedure:                Pre-Anesthesia Assessment:                           - Prior to the procedure, a History and Physical                            was performed, and patient medications and                            allergies were reviewed. The patient's tolerance of                            previous anesthesia was also reviewed. The risks                            and benefits of the procedure and the sedation                            options and risks were discussed with the patient.                            All questions were answered, and informed consent                            was obtained. Prior Anticoagulants: The patient has                            taken no  previous anticoagulant or antiplatelet                            agents. ASA Grade Assessment: II - A patient with                            mild systemic disease. After reviewing the risks                            and benefits, the patient was deemed in                            satisfactory condition to undergo the procedure.                           After obtaining informed consent, the colonoscope                            was passed under  direct vision. Throughout the                            procedure, the patient's blood pressure, pulse, and                            oxygen saturations were monitored continuously. The                            CF [id number] #[id number] was introduced through the anus                            and advanced to the 3 cm into the ileum. A second                            forward view of the right colon was performed. The                            colonoscopy was performed without difficulty. The                            patient tolerated the procedure well. The quality                            of the bowel preparation was good. The terminal                            ileum, ileocecal valve, appendiceal orifice, and                            rectum were photographed. Scope In: 10:43:57 AM Scope Out: 10:58:42 AM Scope Withdrawal Time: 0 hours 10 minutes 23 seconds  Total Procedure Duration: 0 hours 14 minutes 45 seconds  Findings:                 The perianal and digital rectal examinations  were                            normal.                           A 2 mm polyp was found in the ascending colon. The                            polyp was sessile. The polyp was removed with a                            cold snare. Resection and retrieval were complete.                            Estimated blood loss was minimal.                           A 10 mm polyp was found in the proximal ascending                            colon. The polyp was flat. The polyp was removed                            with a cold snare. Resection and retrieval were                            complete. Estimated blood loss was minimal.                           Multiple small and large-mouthed diverticula were                            found in the sigmoid colon and descending colon.                           The exam was otherwise without abnormality on                            direct and retroflexion  views. Complications:            No immediate complications. Estimated Blood Loss:     Estimated blood loss was minimal. Impression:               - One 2 mm polyp in the ascending colon, removed                            with a cold snare. Resected and retrieved.                           - One 10 mm polyp in the proximal ascending colon,                            removed with a cold snare. Resected and retrieved.                           -  Diverticulosis in the sigmoid colon and in the                            descending colon.                           - The examination was otherwise normal on direct                            and retroflexion views. Recommendation:           - Patient has a contact number available for                            emergencies. The signs and symptoms of potential                            delayed complications were discussed with the                            patient. Return to normal activities tomorrow.                            Written discharge instructions were provided to the                            patient.                           - Resume previous diet.                           - Continue present medications.                           - Await pathology results.                           - Repeat colonoscopy to be determined after                            reviewin the pathology results.                           - Emerging evidence supports eating a diet of                            fruits, vegetables, grains, calcium, and yogurt                            while reducing red meat and alcohol may reduce the                            risk of colon cancer.                           - Thank you for  allowing me to be involved in your                            colon cancer prevention. Thornton Park MD, MD 11/04/2021 11:07:25 AM This report has been signed electronically.

## 2021-11-04 NOTE — Patient Instructions (Signed)
    Handouts on polyps & diverticulosis given to you today  Await pathology results on ppolyps removed     YOU HAD AN ENDOSCOPIC PROCEDURE TODAY AT [facility]:   Refer to the procedure report that was given to you for any specific questions about what was found during the examination.  If the procedure report does not answer your questions, please call your gastroenterologist to clarify.  If you requested that your care partner not be given the details of your procedure findings, then the procedure report has been included in a sealed envelope for you to review at your convenience later.  YOU SHOULD EXPECT: Some feelings of bloating in the abdomen. Passage of more gas than usual.  Walking can help get rid of the air that was put into your GI tract during the procedure and reduce the bloating. If you had a lower endoscopy (such as a colonoscopy or flexible sigmoidoscopy) you may notice spotting of blood in your stool or on the toilet paper. If you underwent a bowel prep for your procedure, you may not have a normal bowel movement for a few days.  Please Note:  You might notice some irritation and congestion in your nose or some drainage.  This is from the oxygen used during your procedure.  There is no need for concern and it should clear up in a day or so.  SYMPTOMS TO REPORT IMMEDIATELY:  Following lower endoscopy (colonoscopy or flexible sigmoidoscopy):  Excessive amounts of blood in the stool  Significant tenderness or worsening of abdominal pains  Swelling of the abdomen that is new, acute  Fever of 100F or higher  For urgent or emergent issues, a gastroenterologist can be reached at any hour by calling [phone]. Do not use MyChart messaging for urgent concerns.    DIET:  We do recommend a small meal at first, but then you may proceed to your regular diet.  Drink plenty of fluids but you should avoid alcoholic beverages for 24 hours.  ACTIVITY:  You should  plan to take it easy for the rest of today and you should NOT DRIVE or use heavy machinery until tomorrow (because of the sedation medicines used during the test).    FOLLOW UP: Our staff will call the number listed on your records the next business day following your procedure.  We will call around 7:15- 8:00 am to check on you and address any questions or concerns that you may have regarding the information given to you following your procedure. If we do not reach you, we will leave a message.     If any biopsies were taken you will be contacted by phone or by letter within the next 1-3 weeks.  Please call us at [phone] if you have not heard about the biopsies in 3 weeks.    SIGNATURES/CONFIDENTIALITY: You and/or your care partner have signed paperwork which will be entered into your electronic medical record.  These signatures attest to the fact that that the information above on your After Visit Summary has been reviewed and is understood.  Full responsibility of the confidentiality of this discharge information lies with you and/or your care-partner.

## 2021-11-04 NOTE — Progress Notes (Signed)
Pt's states no medical or surgical changes since previsit or office visit. VS assessed by AS 

## 2021-11-04 NOTE — Progress Notes (Signed)
PT taken to PACU. Monitors in place. VSS. Report given to RN. 

## 2021-11-04 NOTE — Progress Notes (Signed)
Called to room to assist during endoscopic procedure.  Patient ID and intended procedure confirmed with present staff. Received instructions for my participation in the procedure from the performing physician.  

## 2021-11-05 ENCOUNTER — Emergency Department (HOSPITAL_BASED_OUTPATIENT_CLINIC_OR_DEPARTMENT_OTHER): Payer: No Typology Code available for payment source

## 2021-11-05 ENCOUNTER — Emergency Department (HOSPITAL_BASED_OUTPATIENT_CLINIC_OR_DEPARTMENT_OTHER): Payer: No Typology Code available for payment source | Admitting: Radiology

## 2021-11-05 ENCOUNTER — Other Ambulatory Visit: Payer: Self-pay

## 2021-11-05 ENCOUNTER — Encounter (HOSPITAL_BASED_OUTPATIENT_CLINIC_OR_DEPARTMENT_OTHER): Payer: Self-pay | Admitting: Emergency Medicine

## 2021-11-05 ENCOUNTER — Emergency Department (HOSPITAL_BASED_OUTPATIENT_CLINIC_OR_DEPARTMENT_OTHER)
Admission: EM | Admit: 2021-11-05 | Discharge: 2021-11-05 | Disposition: A | Discharge status: Home / Self Care | Payer: No Typology Code available for payment source | Attending: Emergency Medicine | Admitting: Emergency Medicine

## 2021-11-05 DIAGNOSIS — S29012A Strain of muscle and tendon of back wall of thorax, initial encounter: Secondary | ICD-10-CM | POA: Diagnosis not present

## 2021-11-05 DIAGNOSIS — S161XXA Strain of muscle, fascia and tendon at neck level, initial encounter: Secondary | ICD-10-CM | POA: Diagnosis not present

## 2021-11-05 DIAGNOSIS — Y92410 Unspecified street and highway as the place of occurrence of the external cause: Secondary | ICD-10-CM | POA: Insufficient documentation

## 2021-11-05 DIAGNOSIS — R519 Headache, unspecified: Secondary | ICD-10-CM | POA: Diagnosis not present

## 2021-11-05 DIAGNOSIS — I10 Essential (primary) hypertension: Secondary | ICD-10-CM | POA: Insufficient documentation

## 2021-11-05 DIAGNOSIS — S29019A Strain of muscle and tendon of unspecified wall of thorax, initial encounter: Secondary | ICD-10-CM

## 2021-11-05 DIAGNOSIS — S199XXA Unspecified injury of neck, initial encounter: Secondary | ICD-10-CM | POA: Diagnosis present

## 2021-11-05 HISTORY — DX: Diverticulosis of intestine, part unspecified, without perforation or abscess without bleeding: K57.90
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
## 2021-11-07 ENCOUNTER — Ambulatory Visit: Payer: Medicare Other | Admitting: Internal Medicine

## 2021-11-07 ENCOUNTER — Telehealth: Payer: Self-pay

## 2021-11-07 NOTE — Telephone Encounter (Signed)
  Follow up Call-     11/04/2021    9:20 AM  Call back number  Post procedure Call Back phone  # [phone]  Permission to leave phone message Yes     Patient questions:  Do you have a fever, pain , or abdominal swelling? No. Pain Score  0 *  Have you tolerated food without any problems? Yes.    Have you been able to return to your normal activities? Yes.    Do you have any questions about your discharge instructions: Diet   No. Medications  No. Follow up visit  No.  Do you have questions or concerns about your Care? No.  Actions: * If pain score is 4 or above: No action needed, pain <4.

## 2021-11-08 ENCOUNTER — Encounter: Payer: Self-pay | Admitting: Internal Medicine

## 2021-11-08 ENCOUNTER — Ambulatory Visit (INDEPENDENT_AMBULATORY_CARE_PROVIDER_SITE_OTHER): Payer: Medicare Other | Admitting: Internal Medicine

## 2021-11-08 VITALS — BP 148/86 | HR 72 | Temp 98.0°F | Resp 16 | Ht 67.0 in | Wt 157.0 lb

## 2021-11-08 DIAGNOSIS — I10 Essential (primary) hypertension: Secondary | ICD-10-CM | POA: Diagnosis not present

## 2021-11-08 DIAGNOSIS — Z23 Encounter for immunization: Secondary | ICD-10-CM

## 2021-11-08 DIAGNOSIS — E785 Hyperlipidemia, unspecified: Secondary | ICD-10-CM | POA: Diagnosis not present

## 2021-11-08 LAB — CBC WITH DIFFERENTIAL/PLATELET
Basophils Absolute: 0.1 10*3/uL (ref 0.0–0.1)
Basophils Relative: 0.8 % (ref 0.0–3.0)
Eosinophils Absolute: 0.1 10*3/uL (ref 0.0–0.7)
Eosinophils Relative: 2.3 % (ref 0.0–5.0)
HCT: 40.7 % (ref 36.0–46.0)
Hemoglobin: 13.9 g/dL (ref 12.0–15.0)
Lymphocytes Relative: 30.7 % (ref 12.0–46.0)
Lymphs Abs: 2 10*3/uL (ref 0.7–4.0)
MCHC: 34 g/dL (ref 30.0–36.0)
MCV: 83.4 fl (ref 78.0–100.0)
Monocytes Absolute: 0.5 10*3/uL (ref 0.1–1.0)
Monocytes Relative: 8 % (ref 3.0–12.0)
Neutro Abs: 3.8 10*3/uL (ref 1.4–7.7)
Neutrophils Relative %: 58.2 % (ref 43.0–77.0)
Platelets: 225 10*3/uL (ref 150.0–400.0)
RBC: 4.88 Mil/uL (ref 3.87–5.11)
RDW: 12.9 % (ref 11.5–15.5)
WBC: 6.5 10*3/uL (ref 4.0–10.5)

## 2021-11-08 LAB — URINALYSIS, ROUTINE W REFLEX MICROSCOPIC
Hgb urine dipstick: NEGATIVE
Ketones, ur: 40 — AB
Nitrite: NEGATIVE
RBC / HPF: NONE SEEN (ref 0–?)
Specific Gravity, Urine: 1.01 (ref 1.000–1.030)
Total Protein, Urine: NEGATIVE
Urine Glucose: NEGATIVE
Urobilinogen, UA: 0.2 (ref 0.0–1.0)
pH: 6 (ref 5.0–8.0)

## 2021-11-08 LAB — HEPATIC FUNCTION PANEL
ALT: 15 U/L (ref 0–35)
AST: 22 U/L (ref 0–37)
Albumin: 4.5 g/dL (ref 3.5–5.2)
Alkaline Phosphatase: 58 U/L (ref 39–117)
Bilirubin, Direct: 0.1 mg/dL (ref 0.0–0.3)
Total Bilirubin: 0.6 mg/dL (ref 0.2–1.2)
Total Protein: 7.8 g/dL (ref 6.0–8.3)

## 2021-11-08 LAB — LIPID PANEL
Cholesterol: 227 mg/dL — ABNORMAL HIGH (ref 0–200)
HDL: 75.8 mg/dL (ref >39.00)
LDL Cholesterol: 135 mg/dL — ABNORMAL HIGH (ref 0–99)
NonHDL: 150.82
Total CHOL/HDL Ratio: 3
Triglycerides: 81 mg/dL (ref 0.0–149.0)
VLDL: 16.2 mg/dL (ref 0.0–40.0)

## 2021-11-08 LAB — BASIC METABOLIC PANEL
BUN: 9 mg/dL (ref 6–23)
CO2: 24 mEq/L (ref 19–32)
Calcium: 9.8 mg/dL (ref 8.4–10.5)
Chloride: 98 mEq/L (ref 96–112)
Creatinine, Ser: 0.8 mg/dL (ref 0.40–1.20)
GFR: 75.05 mL/min (ref >60.00)
Glucose, Bld: 94 mg/dL (ref 70–99)
Potassium: 3.7 mEq/L (ref 3.5–5.1)
Sodium: 133 mEq/L — ABNORMAL LOW (ref 135–145)

## 2021-11-08 LAB — TSH: TSH: 1.53 u[IU]/mL (ref 0.35–5.50)

## 2021-11-08 MED ORDER — SHINGRIX 50 MCG/0.5ML IM SUSR: 0.5000 mL | Freq: Once | INTRAMUSCULAR | 1 refills | Status: AC
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
## 2021-11-08 NOTE — Patient Instructions (Signed)
Hypertension, Adult High blood pressure (hypertension) is when the force of blood pumping through the arteries is too strong. The arteries are the blood vessels that carry blood from the heart throughout the body. Hypertension forces the heart to work harder to pump blood and may cause arteries to become narrow or stiff. Untreated or uncontrolled hypertension can lead to a heart attack, heart failure, a stroke, kidney disease, and other problems. A blood pressure reading consists of a higher number over a lower number. Ideally, your blood pressure should be below 120/80. The first ("top") number is called the systolic pressure. It is a measure of the pressure in your arteries as your heart beats. The second ("bottom") number is called the diastolic pressure. It is a measure of the pressure in your arteries as the heart relaxes. What are the causes? The exact cause of this condition is not known. There are some conditions that result in high blood pressure. What increases the risk? Certain factors may make you more likely to develop high blood pressure. Some of these risk factors are under your control, including: Smoking. Not getting enough exercise or physical activity. Being overweight. Having too much fat, sugar, calories, or salt (sodium) in your diet. Drinking too much alcohol. Other risk factors include: Having a personal history of heart disease, diabetes, high cholesterol, or kidney disease. Stress. Having a family history of high blood pressure and high cholesterol. Having obstructive sleep apnea. Age. The risk increases with age. What are the signs or symptoms? High blood pressure may not cause symptoms. Very high blood pressure (hypertensive crisis) may cause: Headache. Fast or irregular heartbeats (palpitations). Shortness of breath. Nosebleed. Nausea and vomiting. Vision changes. Severe chest pain, dizziness, and seizures. How is this diagnosed? This condition is diagnosed by  measuring your blood pressure while you are seated, with your arm resting on a flat surface, your legs uncrossed, and your feet flat on the floor. The cuff of the blood pressure monitor will be placed directly against the skin of your upper arm at the level of your heart. Blood pressure should be measured at least twice using the same arm. Certain conditions can cause a difference in blood pressure between your right and left arms. If you have a high blood pressure reading during one visit or you have normal blood pressure with other risk factors, you may be asked to: Return on a different day to have your blood pressure checked again. Monitor your blood pressure at home for 1 week or longer. If you are diagnosed with hypertension, you may have other blood or imaging tests to help your health care provider understand your overall risk for other conditions. How is this treated? This condition is treated by making healthy lifestyle changes, such as eating healthy foods, exercising more, and reducing your alcohol intake. You may be referred for counseling on a healthy diet and physical activity. Your health care provider may prescribe medicine if lifestyle changes are not enough to get your blood pressure under control and if: Your systolic blood pressure is above 130. Your diastolic blood pressure is above 80. Your personal target blood pressure may vary depending on your medical conditions, your age, and other factors. Follow these instructions at home: Eating and drinking  Eat a diet that is high in fiber and potassium, and low in sodium, added sugar, and fat. An example of this eating plan is called the DASH diet. DASH stands for Dietary Approaches to Stop Hypertension. To eat this way: Eat   plenty of fresh fruits and vegetables. Try to fill one half of your plate at each meal with fruits and vegetables. Eat whole grains, such as whole-wheat pasta, brown rice, or whole-grain bread. Fill about one  fourth of your plate with whole grains. Eat or drink low-fat dairy products, such as skim milk or low-fat yogurt. Avoid fatty cuts of meat, processed or cured meats, and poultry with skin. Fill about one fourth of your plate with lean proteins, such as fish, chicken without skin, beans, eggs, or tofu. Avoid pre-made and processed foods. These tend to be higher in sodium, added sugar, and fat. Reduce your daily sodium intake. Many people with hypertension should eat less than 1,500 mg of sodium a day. Do not drink alcohol if: Your health care provider tells you not to drink. You are pregnant, may be pregnant, or are planning to become pregnant. If you drink alcohol: Limit how much you have to: 0-1 drink a day for women. 0-2 drinks a day for men. Know how much alcohol is in your drink. In the [place] one drink equals one 12 oz bottle of beer (355 mL), one 5 oz glass of wine (148 mL), or one 1 oz glass of hard liquor (44 mL). Lifestyle  Work with your health care provider to maintain a healthy body weight or to lose weight. Ask what an ideal weight is for you. Get at least 30 minutes of exercise that causes your heart to beat faster (aerobic exercise) most days of the week. Activities may include walking, swimming, or biking. Include exercise to strengthen your muscles (resistance exercise), such as Pilates or lifting weights, as part of your weekly exercise routine. Try to do these types of exercises for 30 minutes at least 3 days a week. Do not use any products that contain nicotine or tobacco. These products include cigarettes, chewing tobacco, and vaping devices, such as e-cigarettes. If you need help quitting, ask your health care provider. Monitor your blood pressure at home as told by your health care provider. Keep all follow-up visits. This is important. Medicines Take over-the-counter and prescription medicines only as told by your health care provider. Follow directions carefully. Blood  pressure medicines must be taken as prescribed. Do not skip doses of blood pressure medicine. Doing this puts you at risk for problems and can make the medicine less effective. Ask your health care provider about side effects or reactions to medicines that you should watch for. Contact a health care provider if you: Think you are having a reaction to a medicine you are taking. Have headaches that keep coming back (recurring). Feel dizzy. Have swelling in your ankles. Have trouble with your vision. Get help right away if you: Develop a severe headache or confusion. Have unusual weakness or numbness. Feel faint. Have severe pain in your chest or abdomen. Vomit repeatedly. Have trouble breathing. These symptoms may be an emergency. Get help right away. Call 911. Do not wait to see if the symptoms will go away. Do not drive yourself to the hospital. Summary Hypertension is when the force of blood pumping through your arteries is too strong. If this condition is not controlled, it may put you at risk for serious complications. Your personal target blood pressure may vary depending on your medical conditions, your age, and other factors. For most people, a normal blood pressure is less than 120/80. Hypertension is treated with lifestyle changes, medicines, or a combination of both. Lifestyle changes include losing weight, eating a healthy,   low-sodium diet, exercising more, and limiting alcohol. This information is not intended to replace advice given to you by your health care provider. Make sure you discuss any questions you have with your health care provider. Document Revised: 11/23/2020 Document Reviewed: 11/23/2020 Elsevier Patient Education  2023 Elsevier Inc.  

## 2021-11-10 ENCOUNTER — Telehealth: Payer: Self-pay

## 2021-11-10 NOTE — Telephone Encounter (Addendum)
   Reason for call: ED-Follow up call  Patient  visit on 11/05/2021 at [facility]  Have you been able to follow up with your primary care physician? - Yes  The patient was or was not able to obtain any needed medicine or equipment. - Was, Yes  Are there diet recommendations that you are having difficulty following? - No  Patient expresses understanding of discharge instructions and education provided has no other needs at this time.   [facility]  [place], [place]  Main Phone: [phone]  E-mail: [email or web address]  Website: [email or web address]

## 2021-11-17 ENCOUNTER — Encounter: Payer: Self-pay | Admitting: Gastroenterology

## 2021-11-30 ENCOUNTER — Other Ambulatory Visit: Payer: Self-pay | Admitting: Internal Medicine

## 2021-11-30 DIAGNOSIS — Z1231 Encounter for screening mammogram for malignant neoplasm of breast: Secondary | ICD-10-CM

## 2022-01-26 ENCOUNTER — Ambulatory Visit: Payer: Medicare Other

## 2022-01-31 ENCOUNTER — Other Ambulatory Visit: Payer: Self-pay | Admitting: Internal Medicine

## 2022-01-31 ENCOUNTER — Ambulatory Visit
Admission: RE | Admit: 2022-01-31 | Discharge: 2022-01-31 | Disposition: A | Discharge status: Home / Self Care | Payer: Medicare Other | Source: Ambulatory Visit | Attending: Internal Medicine | Admitting: Internal Medicine

## 2022-01-31 DIAGNOSIS — I10 Essential (primary) hypertension: Secondary | ICD-10-CM

## 2022-01-31 DIAGNOSIS — Z1231 Encounter for screening mammogram for malignant neoplasm of breast: Secondary | ICD-10-CM

## 2022-01-31 MED ORDER — CANDESARTAN CILEXETIL 16 MG PO TABS: 16.0000 mg | ORAL_TABLET | Freq: Every day | ORAL | 0 refills | Status: DC

## 2022-02-01 ENCOUNTER — Encounter: Payer: Self-pay | Admitting: Internal Medicine

## 2022-05-10 ENCOUNTER — Ambulatory Visit (INDEPENDENT_AMBULATORY_CARE_PROVIDER_SITE_OTHER): Payer: Medicare Other | Admitting: Internal Medicine

## 2022-05-10 ENCOUNTER — Encounter: Payer: Self-pay | Admitting: Internal Medicine

## 2022-05-10 VITALS — BP 138/86 | HR 60 | Temp 98.0°F | Ht 67.0 in | Wt 156.0 lb

## 2022-05-10 DIAGNOSIS — E871 Hypo-osmolality and hyponatremia: Secondary | ICD-10-CM | POA: Diagnosis not present

## 2022-05-10 DIAGNOSIS — I10 Essential (primary) hypertension: Secondary | ICD-10-CM

## 2022-05-10 LAB — URINALYSIS, ROUTINE W REFLEX MICROSCOPIC
Bilirubin Urine: NEGATIVE
Hgb urine dipstick: NEGATIVE
Ketones, ur: 15 — AB
Leukocytes,Ua: NEGATIVE
Nitrite: NEGATIVE
Specific Gravity, Urine: 1.015 (ref 1.000–1.030)
Total Protein, Urine: NEGATIVE
Urine Glucose: NEGATIVE
Urobilinogen, UA: 0.2 (ref 0.0–1.0)
pH: 6 (ref 5.0–8.0)

## 2022-05-10 LAB — BASIC METABOLIC PANEL
BUN: 16 mg/dL (ref 6–23)
CO2: 26 mEq/L (ref 19–32)
Calcium: 9.8 mg/dL (ref 8.4–10.5)
Chloride: 100 mEq/L (ref 96–112)
Creatinine, Ser: 0.85 mg/dL (ref 0.40–1.20)
GFR: 69.54 mL/min (ref >60.00)
Glucose, Bld: 98 mg/dL (ref 70–99)
Potassium: 4.2 mEq/L (ref 3.5–5.1)
Sodium: 135 mEq/L (ref 135–145)

## 2022-05-10 MED ORDER — CANDESARTAN CILEXETIL 16 MG PO TABS: 16.0000 mg | ORAL_TABLET | Freq: Every day | ORAL | 1 refills | Status: DC

## 2022-05-10 NOTE — Patient Instructions (Signed)
Hyponatremia Hyponatremia is when the amount of salt (sodium) in a person's blood is too low. When sodium levels are low, the cells absorb extra water, which causes them to swell. The swelling happens throughout the body, but it mostly affects the brain. What are the causes? This condition may be caused by: Certain medical conditions, such as: Heart, kidney, or liver problems. Thyroid problems. Adrenal gland problems. Metabolic conditions, such as Addison's disease or syndrome of inappropriate antidiuresis (SIAD). Excessive vomiting, diarrhea, or sweating. Certain medicines or illegal drugs. Fluids given through an IV. What increases the risk? You are more likely to develop this condition if you: Have certain medical conditions such as heart, kidney, or liver failure. Have a medical condition that causes frequent or excessive diarrhea. Participate in intense physical activities, such as marathon running. Take certain medicines that affect the sodium and fluid balance in the blood. Some of these medicine types include: Diuretics. NSAIDs, such as ibuprofen. Some opioid pain medicines. Some antidepressants. Some seizure prevention medicines. What are the signs or symptoms? Symptoms of this condition include: Headache. Nausea and vomiting. Being very tired (lethargic). Muscle weakness and cramping. Loss of appetite. Feeling weak or light-headed. Severe symptoms of this condition include: Confusion. Agitation. Having a rapid heart rate. Fainting. Seizures. Coma. How is this diagnosed? This condition is diagnosed based on: A physical exam. Your medical history. Tests, including: Blood tests. Urine tests. How is this treated? Treatment for this condition depends on the cause. Treatment may include: Getting fluids through an IV that is inserted into one of your veins. Medicines to correct the sodium imbalance. If medicines are causing the condition, the medicines will need to  be adjusted. Limiting your water or fluid intake to get the correct sodium balance, in certain cases. Monitoring in the hospital to closely watch your symptoms for improvement. Follow these instructions at home:  Take over-the-counter and prescription medicines only as told by your health care provider. Many medicines can make this condition worse. Talk with your health care provider about any medicines that you are currently taking. Do not drink alcohol. Keep all follow-up visits. This is important. Contact a health care provider if: You develop worsening nausea, fatigue, headache, confusion, or weakness. Your symptoms go away and then return. Get help right away if: You have a seizure. You faint. You have ongoing diarrhea or vomiting. Summary Hyponatremia is when the amount of salt (sodium) in your blood is too low. When sodium levels are low, your cells absorb extra water, which causes them to swell. The swelling happens throughout the body, but it mostly affects the brain. Treatment for this condition depends on the cause. It may include receiving IV fluids, taking or adjusting medicines, limiting fluid intake, and monitoring in the hospital. This information is not intended to replace advice given to you by your health care provider. Make sure you discuss any questions you have with your health care provider. Document Revised: 07/27/2020 Document Reviewed: 07/27/2020 Elsevier Patient Education  2023 Elsevier Inc.  

## 2022-05-10 NOTE — Progress Notes (Signed)
Subjective:  Patient ID: Joanna Anthony, female    DOB: 1950/04/09  Age: 72 y.o. MRN: [id number]  CC: Hypertension   HPI MAYLIE ASHTON presents for f/up ----  She walks 2 miles a day and has good endurance.  She denies chest pain, shortness of breath, diaphoresis, or edema.  Outpatient Medications Prior to Visit  Medication Sig Dispense Refill   Acetaminophen 500 MG capsule as needed.     Biotin 1 MG CAPS Take 1 tablet by mouth daily.     Calcium Carbonate Antacid (TUMS PO) TAKE 2 CHEWABLES FOR HEARTBURN AS NEEDED     cholecalciferol (VITAMIN D3) 25 MCG (1000 UT) tablet Take 2,000 Units by mouth daily.     Magnesium Oxide 250 MG TABS Take by mouth.     candesartan (ATACAND) 16 MG tablet Take 1 tablet (16 mg total) by mouth daily. 90 tablet 0   0.9 %  sodium chloride infusion      No facility-administered medications prior to visit.    ROS Review of Systems  Constitutional: Negative.  Negative for diaphoresis and fatigue.  HENT: Negative.    Eyes: Negative.   Respiratory:  Negative for cough, chest tightness, shortness of breath and wheezing.   Cardiovascular:  Negative for chest pain, palpitations and leg swelling.  Gastrointestinal:  Negative for abdominal pain, diarrhea, nausea and vomiting.  Endocrine: Negative.   Genitourinary: Negative.  Negative for difficulty urinating.  Skin: Negative.   Neurological:  Negative for dizziness and weakness.  Hematological:  Negative for adenopathy. Does not bruise/bleed easily.  Psychiatric/Behavioral: Negative.      Objective:  BP 138/86 (BP Location: Left Arm, Patient Position: Sitting, Cuff Size: Large)   Pulse 60   Temp 98 F (36.7 C) (Oral)   Ht  (1.702 m)   Wt 156 lb (70.8 kg)   SpO2 95%   BMI 24.43 kg/m   BP Readings from Last 3 Encounters:  05/10/22 138/86  11/08/21 (!) 148/86  11/05/21 (!) 168/80    Wt Readings from Last 3 Encounters:  05/10/22 156 lb (70.8 kg)  11/08/21 157 lb (71.2 kg)  11/04/21  162 lb 3.2 oz (73.6 kg)    Physical Exam Vitals reviewed.  HENT:     Nose: Nose normal.     Mouth/Throat:     Mouth: Mucous membranes are moist.  Eyes:     General: No scleral icterus.    Conjunctiva/sclera: Conjunctivae normal.  Cardiovascular:     Rate and Rhythm: Normal rate and regular rhythm.     Heart sounds: No murmur heard.    No gallop.  Pulmonary:     Effort: Pulmonary effort is normal.     Breath sounds: No stridor. No wheezing, rhonchi or rales.  Abdominal:     General: Abdomen is flat.     Palpations: There is no mass.     Tenderness: There is no abdominal tenderness. There is no guarding.     Hernia: No hernia is present.  Musculoskeletal:     Cervical back: Neck supple.     Right lower leg: No edema.     Left lower leg: No edema.  Lymphadenopathy:     Cervical: No cervical adenopathy.  Skin:    General: Skin is warm and dry.  Neurological:     General: No focal deficit present.     Mental Status: She is alert. Mental status is at baseline.  Psychiatric:        Mood and  Affect: Mood normal.        Behavior: Behavior normal.     Lab Results  Component Value Date   WBC 6.5 11/08/2021   HGB 13.9 11/08/2021   HCT 40.7 11/08/2021   PLT 225.0 11/08/2021   GLUCOSE 98 05/10/2022   CHOL 227 (H) 11/08/2021   TRIG 81.0 11/08/2021   HDL 75.80 11/08/2021   LDLDIRECT 135.7 01/12/2012   LDLCALC 135 (H) 11/08/2021   ALT 15 11/08/2021   AST 22 11/08/2021   NA 135 05/10/2022   K 4.2 05/10/2022   CL 100 05/10/2022   CREATININE 0.85 05/10/2022   BUN 16 05/10/2022   CO2 26 05/10/2022   TSH 1.53 11/08/2021   HGBA1C 5.5 01/09/2018    MM 3D SCREEN BREAST BILATERAL  Result Date: 02/01/2022 CLINICAL DATA:  Screening. EXAM: DIGITAL SCREENING BILATERAL MAMMOGRAM WITH TOMOSYNTHESIS AND CAD TECHNIQUE: Bilateral screening digital craniocaudal and mediolateral oblique mammograms were obtained. Bilateral screening digital breast tomosynthesis was performed. The images  were evaluated with computer-aided detection. COMPARISON:  Previous exam(s). ACR Breast Density Category b: There are scattered areas of fibroglandular density. FINDINGS: There are no findings suspicious for malignancy. IMPRESSION: No mammographic evidence of malignancy. A result letter of this screening mammogram will be mailed directly to the patient. RECOMMENDATION: Screening mammogram in one year. (Code:SM-B-01Y) BI-RADS CATEGORY  1: Negative. Electronically Signed   By: Elberta Fortis M.D.   On: 02/01/2022 10:42    Assessment & Plan:   Essential hypertension- Her blood pressure is well-controlled.  Electrolytes and renal function are normal. -     Basic metabolic panel; Future -     Urinalysis, Routine w reflex microscopic; Future -     Sodium, urine, random; Future -     Candesartan Cilexetil; Take 1 tablet (16 mg total) by mouth daily.  Dispense: 90 tablet; Refill: 1  Chronic hyponatremia- Her sodium is normal and she is asymptomatic. -     Sodium, urine, random; Future     Follow-up: Return in about 6 months (around 11/09/2022).  Sanda Linger, MD

## 2022-05-11 LAB — SODIUM, URINE, RANDOM: Sodium, Ur: 62 mmol/L (ref 28–272)

## 2022-10-02 ENCOUNTER — Encounter: Payer: Self-pay | Admitting: Internal Medicine

## 2022-10-02 DIAGNOSIS — I10 Essential (primary) hypertension: Secondary | ICD-10-CM

## 2022-10-03 MED ORDER — CANDESARTAN CILEXETIL 16 MG PO TABS: 16.0000 mg | ORAL_TABLET | Freq: Every day | ORAL | 0 refills | Status: DC

## 2022-10-08 ENCOUNTER — Encounter: Payer: Self-pay | Admitting: Internal Medicine

## 2022-11-09 ENCOUNTER — Ambulatory Visit: Payer: Medicare Other | Admitting: Internal Medicine

## 2022-12-13 ENCOUNTER — Ambulatory Visit (INDEPENDENT_AMBULATORY_CARE_PROVIDER_SITE_OTHER): Payer: Medicare Other | Admitting: Internal Medicine

## 2022-12-13 ENCOUNTER — Encounter: Payer: Self-pay | Admitting: Internal Medicine

## 2022-12-13 VITALS — BP 144/82 | HR 71 | Temp 97.8°F | Resp 16 | Ht 67.0 in | Wt 163.0 lb

## 2022-12-13 DIAGNOSIS — I10 Essential (primary) hypertension: Secondary | ICD-10-CM | POA: Diagnosis not present

## 2022-12-13 DIAGNOSIS — E785 Hyperlipidemia, unspecified: Secondary | ICD-10-CM

## 2022-12-13 LAB — LIPID PANEL
Cholesterol: 226 mg/dL — ABNORMAL HIGH (ref 0–200)
HDL: 68.6 mg/dL (ref >39.00)
LDL Cholesterol: 137 mg/dL — ABNORMAL HIGH (ref 0–99)
NonHDL: 157.13
Total CHOL/HDL Ratio: 3
Triglycerides: 103 mg/dL (ref 0.0–149.0)
VLDL: 20.6 mg/dL (ref 0.0–40.0)

## 2022-12-13 LAB — CBC WITH DIFFERENTIAL/PLATELET
Basophils Absolute: 0 10*3/uL (ref 0.0–0.1)
Basophils Relative: 0.8 % (ref 0.0–3.0)
Eosinophils Absolute: 0.2 10*3/uL (ref 0.0–0.7)
Eosinophils Relative: 3.7 % (ref 0.0–5.0)
HCT: 41.2 % (ref 36.0–46.0)
Hemoglobin: 13.7 g/dL (ref 12.0–15.0)
Lymphocytes Relative: 29 % (ref 12.0–46.0)
Lymphs Abs: 1.9 10*3/uL (ref 0.7–4.0)
MCHC: 33.2 g/dL (ref 30.0–36.0)
MCV: 86 fL (ref 78.0–100.0)
Monocytes Absolute: 0.5 10*3/uL (ref 0.1–1.0)
Monocytes Relative: 7 % (ref 3.0–12.0)
Neutro Abs: 3.9 10*3/uL (ref 1.4–7.7)
Neutrophils Relative %: 59.5 % (ref 43.0–77.0)
Platelets: 243 10*3/uL (ref 150.0–400.0)
RBC: 4.79 Mil/uL (ref 3.87–5.11)
RDW: 13.3 % (ref 11.5–15.5)
WBC: 6.5 10*3/uL (ref 4.0–10.5)

## 2022-12-13 LAB — BASIC METABOLIC PANEL
BUN: 15 mg/dL (ref 6–23)
CO2: 26 meq/L (ref 19–32)
Calcium: 9.3 mg/dL (ref 8.4–10.5)
Chloride: 99 meq/L (ref 96–112)
Creatinine, Ser: 0.85 mg/dL (ref 0.40–1.20)
GFR: 69.25 mL/min (ref >60.00)
Glucose, Bld: 92 mg/dL (ref 70–99)
Potassium: 4 meq/L (ref 3.5–5.1)
Sodium: 133 meq/L — ABNORMAL LOW (ref 135–145)

## 2022-12-13 LAB — HEPATIC FUNCTION PANEL
ALT: 12 U/L (ref 0–35)
AST: 18 U/L (ref 0–37)
Albumin: 4.4 g/dL (ref 3.5–5.2)
Alkaline Phosphatase: 65 U/L (ref 39–117)
Bilirubin, Direct: 0.1 mg/dL (ref 0.0–0.3)
Total Bilirubin: 0.7 mg/dL (ref 0.2–1.2)
Total Protein: 7.5 g/dL (ref 6.0–8.3)

## 2022-12-13 LAB — TSH: TSH: 1.3 u[IU]/mL (ref 0.35–5.50)

## 2022-12-13 NOTE — Progress Notes (Signed)
Subjective:  Patient ID: Joanna Anthony, female    DOB: 02-24-1950  Age: 72 y.o. MRN: [id number]  CC: Hypertension and Hyperlipidemia   HPI RANIJAH SNYDERS presents for f/up ----  Discussed the use of AI scribe software for clinical note transcription with the patient, who gave verbal consent to proceed.  History of Present Illness   The patient, with a history of hypertension, presented with a tension headache that started after consuming [place] onion soup and engaging in outdoor activities such as raking leaves. She denied experiencing any associated symptoms such as nausea, vomiting, numbness, weakness, or tingling. The patient reported a discrepancy in blood pressure readings taken at home and in the clinic, with higher readings observed in the clinic. She denied any side effects from her current antihypertensive medication but expressed concerns about its increasing cost and expressed interest in switching to a cheaper alternative.  The patient remained semi-active and denied experiencing any chest pain or shortness of breath during physical activity. She denied any abdominal issues such as pain, constipation, or diarrhea.       Outpatient Medications Prior to Visit  Medication Sig Dispense Refill   Acetaminophen 500 MG capsule as needed.     Calcium Carbonate Antacid (TUMS PO) TAKE 2 CHEWABLES FOR HEARTBURN AS NEEDED     cholecalciferol (VITAMIN D3) 25 MCG (1000 UT) tablet Take 2,000 Units by mouth daily.     Magnesium Oxide 250 MG TABS Take by mouth.     candesartan (ATACAND) 16 MG tablet Take 1 tablet (16 mg total) by mouth daily. 90 tablet 0   Biotin 1 MG CAPS Take 1 tablet by mouth daily.     No facility-administered medications prior to visit.    ROS Review of Systems  Constitutional:  Negative for chills, diaphoresis, fatigue and fever.  HENT: Negative.    Eyes: Negative.   Respiratory: Negative.  Negative for cough, chest tightness, shortness of breath and  wheezing.   Cardiovascular:  Negative for chest pain, palpitations and leg swelling.  Gastrointestinal:  Negative for abdominal pain, constipation, diarrhea, nausea and vomiting.  Genitourinary: Negative.  Negative for difficulty urinating.  Musculoskeletal: Negative.  Negative for arthralgias, back pain and myalgias.  Skin: Negative.   Neurological: Negative.  Negative for dizziness and weakness.  Hematological:  Negative for adenopathy. Does not bruise/bleed easily.  Psychiatric/Behavioral: Negative.      Objective:  BP (!) 144/82 (BP Location: Left Arm, Patient Position: Sitting, Cuff Size: Normal)   Pulse 71   Temp 97.8 F (36.6 C) (Oral)   Resp 16   Ht 5\' 7"  (1.702 m)   Wt 163 lb (73.9 kg)   SpO2 95%   BMI 25.53 kg/m   BP Readings from Last 3 Encounters:  12/13/22 (!) 144/82  05/10/22 138/86  11/08/21 (!) 148/86    Wt Readings from Last 3 Encounters:  12/13/22 163 lb (73.9 kg)  05/10/22 156 lb (70.8 kg)  11/08/21 157 lb (71.2 kg)    Physical Exam Vitals reviewed.  Constitutional:      Appearance: She is not ill-appearing.  HENT:     Mouth/Throat:     Mouth: Mucous membranes are moist.  Eyes:     General: No scleral icterus.    Conjunctiva/sclera: Conjunctivae normal.  Cardiovascular:     Rate and Rhythm: Normal rate and regular rhythm.     Heart sounds: Normal heart sounds, S1 normal and S2 normal. No murmur heard.    No gallop.  Comments: EKG- NSR, 68 bpm Anteroseptal infarct pattern is not new No LVH or acute ST/T wave changes Unchanged Pulmonary:     Effort: Pulmonary effort is normal.     Breath sounds: No stridor. No wheezing, rhonchi or rales.  Abdominal:     General: Abdomen is flat.     Palpations: There is no mass.     Tenderness: There is no abdominal tenderness. There is no guarding.     Hernia: No hernia is present.  Musculoskeletal:     Cervical back: Neck supple.     Right lower leg: No edema.     Left lower leg: No edema.   Skin:    General: Skin is warm.     Findings: No lesion or rash.  Neurological:     General: No focal deficit present.     Mental Status: She is alert. Mental status is at baseline.  Psychiatric:        Mood and Affect: Mood normal.        Behavior: Behavior normal.     Lab Results  Component Value Date   WBC 6.5 12/13/2022   HGB 13.7 12/13/2022   HCT 41.2 12/13/2022   PLT 243.0 12/13/2022   GLUCOSE 92 12/13/2022   CHOL 226 (H) 12/13/2022   TRIG 103.0 12/13/2022   HDL 68.60 12/13/2022   LDLDIRECT 135.7 01/12/2012   LDLCALC 137 (H) 12/13/2022   ALT 12 12/13/2022   AST 18 12/13/2022   NA 133 (L) 12/13/2022   K 4.0 12/13/2022   CL 99 12/13/2022   CREATININE 0.85 12/13/2022   BUN 15 12/13/2022   CO2 26 12/13/2022   TSH 1.30 12/13/2022   HGBA1C 5.5 01/09/2018    MM 3D SCREEN BREAST BILATERAL  Result Date: 02/01/2022 CLINICAL DATA:  Screening. EXAM: DIGITAL SCREENING BILATERAL MAMMOGRAM WITH TOMOSYNTHESIS AND CAD TECHNIQUE: Bilateral screening digital craniocaudal and mediolateral oblique mammograms were obtained. Bilateral screening digital breast tomosynthesis was performed. The images were evaluated with computer-aided detection. COMPARISON:  Previous exam(s). ACR Breast Density Category b: There are scattered areas of fibroglandular density. FINDINGS: There are no findings suspicious for malignancy. IMPRESSION: No mammographic evidence of malignancy. A result letter of this screening mammogram will be mailed directly to the patient. RECOMMENDATION: Screening mammogram in one year. (Code:SM-B-01Y) BI-RADS CATEGORY  1: Negative. Electronically Signed   By: Elberta Fortis M.D.   On: 02/01/2022 10:42    Assessment & Plan:   Essential hypertension- EKG is negative for LVH. Her BP is not a goal. Will upgrade to a more potent ARB. -     Basic metabolic panel; Future -     CBC with Differential/Platelet; Future -     TSH; Future -     Urinalysis, Routine w reflex microscopic;  Future -     Hepatic function panel; Future -     EKG 12-Lead -     Olmesartan Medoxomil; Take 1 tablet (20 mg total) by mouth daily.  Dispense: 90 tablet; Refill: 1  Hyperlipidemia LDL goal <130- LP(a) is normal. I have recommended a statin for CV risk reduction. -     Lipid panel; Future -     Lipoprotein A (LPA); Future -     TSH; Future -     Hepatic function panel; Future -     Rosuvastatin Calcium; Take 1 tablet (10 mg total) by mouth daily.  Dispense: 90 tablet; Refill: 1     Follow-up: Return in about 6 months (around 06/12/2023).  Sanda Linger, MD

## 2022-12-13 NOTE — Patient Instructions (Signed)
Hypertension, Adult High blood pressure (hypertension) is when the force of blood pumping through the arteries is too strong. The arteries are the blood vessels that carry blood from the heart throughout the body. Hypertension forces the heart to work harder to pump blood and may cause arteries to become narrow or stiff. Untreated or uncontrolled hypertension can lead to a heart attack, heart failure, a stroke, kidney disease, and other problems. A blood pressure reading consists of a higher number over a lower number. Ideally, your blood pressure should be below 120/80. The first ("top") number is called the systolic pressure. It is a measure of the pressure in your arteries as your heart beats. The second ("bottom") number is called the diastolic pressure. It is a measure of the pressure in your arteries as the heart relaxes. What are the causes? The exact cause of this condition is not known. There are some conditions that result in high blood pressure. What increases the risk? Certain factors may make you more likely to develop high blood pressure. Some of these risk factors are under your control, including: Smoking. Not getting enough exercise or physical activity. Being overweight. Having too much fat, sugar, calories, or salt (sodium) in your diet. Drinking too much alcohol. Other risk factors include: Having a personal history of heart disease, diabetes, high cholesterol, or kidney disease. Stress. Having a family history of high blood pressure and high cholesterol. Having obstructive sleep apnea. Age. The risk increases with age. What are the signs or symptoms? High blood pressure may not cause symptoms. Very high blood pressure (hypertensive crisis) may cause: Headache. Fast or irregular heartbeats (palpitations). Shortness of breath. Nosebleed. Nausea and vomiting. Vision changes. Severe chest pain, dizziness, and seizures. How is this diagnosed? This condition is diagnosed by  measuring your blood pressure while you are seated, with your arm resting on a flat surface, your legs uncrossed, and your feet flat on the floor. The cuff of the blood pressure monitor will be placed directly against the skin of your upper arm at the level of your heart. Blood pressure should be measured at least twice using the same arm. Certain conditions can cause a difference in blood pressure between your right and left arms. If you have a high blood pressure reading during one visit or you have normal blood pressure with other risk factors, you may be asked to: Return on a different day to have your blood pressure checked again. Monitor your blood pressure at home for 1 week or longer. If you are diagnosed with hypertension, you may have other blood or imaging tests to help your health care provider understand your overall risk for other conditions. How is this treated? This condition is treated by making healthy lifestyle changes, such as eating healthy foods, exercising more, and reducing your alcohol intake. You may be referred for counseling on a healthy diet and physical activity. Your health care provider may prescribe medicine if lifestyle changes are not enough to get your blood pressure under control and if: Your systolic blood pressure is above 130. Your diastolic blood pressure is above 80. Your personal target blood pressure may vary depending on your medical conditions, your age, and other factors. Follow these instructions at home: Eating and drinking  Eat a diet that is high in fiber and potassium, and low in sodium, added sugar, and fat. An example of this eating plan is called the DASH diet. DASH stands for Dietary Approaches to Stop Hypertension. To eat this way: Eat   plenty of fresh fruits and vegetables. Try to fill one half of your plate at each meal with fruits and vegetables. Eat whole grains, such as whole-wheat pasta, brown rice, or whole-grain bread. Fill about one  fourth of your plate with whole grains. Eat or drink low-fat dairy products, such as skim milk or low-fat yogurt. Avoid fatty cuts of meat, processed or cured meats, and poultry with skin. Fill about one fourth of your plate with lean proteins, such as fish, chicken without skin, beans, eggs, or tofu. Avoid pre-made and processed foods. These tend to be higher in sodium, added sugar, and fat. Reduce your daily sodium intake. Many people with hypertension should eat less than 1,500 mg of sodium a day. Do not drink alcohol if: Your health care provider tells you not to drink. You are pregnant, may be pregnant, or are planning to become pregnant. If you drink alcohol: Limit how much you have to: 0-1 drink a day for women. 0-2 drinks a day for men. Know how much alcohol is in your drink. In the [place] one drink equals one 12 oz bottle of beer (355 mL), one 5 oz glass of wine (148 mL), or one 1 oz glass of hard liquor (44 mL). Lifestyle  Work with your health care provider to maintain a healthy body weight or to lose weight. Ask what an ideal weight is for you. Get at least 30 minutes of exercise that causes your heart to beat faster (aerobic exercise) most days of the week. Activities may include walking, swimming, or biking. Include exercise to strengthen your muscles (resistance exercise), such as Pilates or lifting weights, as part of your weekly exercise routine. Try to do these types of exercises for 30 minutes at least 3 days a week. Do not use any products that contain nicotine or tobacco. These products include cigarettes, chewing tobacco, and vaping devices, such as e-cigarettes. If you need help quitting, ask your health care provider. Monitor your blood pressure at home as told by your health care provider. Keep all follow-up visits. This is important. Medicines Take over-the-counter and prescription medicines only as told by your health care provider. Follow directions carefully. Blood  pressure medicines must be taken as prescribed. Do not skip doses of blood pressure medicine. Doing this puts you at risk for problems and can make the medicine less effective. Ask your health care provider about side effects or reactions to medicines that you should watch for. Contact a health care provider if you: Think you are having a reaction to a medicine you are taking. Have headaches that keep coming back (recurring). Feel dizzy. Have swelling in your ankles. Have trouble with your vision. Get help right away if you: Develop a severe headache or confusion. Have unusual weakness or numbness. Feel faint. Have severe pain in your chest or abdomen. Vomit repeatedly. Have trouble breathing. These symptoms may be an emergency. Get help right away. Call 911. Do not wait to see if the symptoms will go away. Do not drive yourself to the hospital. Summary Hypertension is when the force of blood pumping through your arteries is too strong. If this condition is not controlled, it may put you at risk for serious complications. Your personal target blood pressure may vary depending on your medical conditions, your age, and other factors. For most people, a normal blood pressure is less than 120/80. Hypertension is treated with lifestyle changes, medicines, or a combination of both. Lifestyle changes include losing weight, eating a healthy,   low-sodium diet, exercising more, and limiting alcohol. This information is not intended to replace advice given to you by your health care provider. Make sure you discuss any questions you have with your health care provider. Document Revised: 11/23/2020 Document Reviewed: 11/23/2020 Elsevier Patient Education  2024 [employer]  

## 2022-12-14 ENCOUNTER — Encounter: Payer: Self-pay | Admitting: Internal Medicine

## 2022-12-14 LAB — URINALYSIS, ROUTINE W REFLEX MICROSCOPIC
Bilirubin Urine: NEGATIVE
Hgb urine dipstick: NEGATIVE
Ketones, ur: 15 — AB
Nitrite: NEGATIVE
Specific Gravity, Urine: 1.02 (ref 1.000–1.030)
Total Protein, Urine: NEGATIVE
Urine Glucose: NEGATIVE
Urobilinogen, UA: 0.2 (ref 0.0–1.0)
pH: 5.5 (ref 5.0–8.0)

## 2022-12-14 MED ORDER — ROSUVASTATIN CALCIUM 10 MG PO TABS: 10.0000 mg | ORAL_TABLET | Freq: Every day | ORAL | 1 refills | Status: DC

## 2022-12-14 MED ORDER — OLMESARTAN MEDOXOMIL 20 MG PO TABS: 20.0000 mg | ORAL_TABLET | Freq: Every day | ORAL | 1 refills | Status: DC

## 2022-12-20 LAB — LIPOPROTEIN A (LPA): Lipoprotein (a): 25 nmol/L (ref <75)

## 2023-01-05 ENCOUNTER — Other Ambulatory Visit: Payer: Self-pay | Admitting: Internal Medicine

## 2023-01-05 DIAGNOSIS — Z1231 Encounter for screening mammogram for malignant neoplasm of breast: Secondary | ICD-10-CM

## 2023-02-02 ENCOUNTER — Ambulatory Visit
Admission: RE | Admit: 2023-02-02 | Discharge: 2023-02-02 | Disposition: A | Discharge status: Home / Self Care | Payer: Medicare Other | Source: Ambulatory Visit | Attending: Internal Medicine | Admitting: Internal Medicine

## 2023-02-02 DIAGNOSIS — Z1231 Encounter for screening mammogram for malignant neoplasm of breast: Secondary | ICD-10-CM

## 2023-02-24 ENCOUNTER — Encounter: Payer: Self-pay | Admitting: Internal Medicine

## 2023-02-26 NOTE — Telephone Encounter (Signed)
Patient scheduled to see Moshe Cipro, NP 1/28 at 10:20

## 2023-02-27 ENCOUNTER — Encounter: Payer: Self-pay | Admitting: Family Medicine

## 2023-02-27 ENCOUNTER — Ambulatory Visit (INDEPENDENT_AMBULATORY_CARE_PROVIDER_SITE_OTHER): Payer: Medicare Other | Admitting: Family Medicine

## 2023-02-27 ENCOUNTER — Ambulatory Visit (INDEPENDENT_AMBULATORY_CARE_PROVIDER_SITE_OTHER): Payer: Medicare Other

## 2023-02-27 VITALS — BP 160/90 | HR 72 | Ht 67.0 in

## 2023-02-27 DIAGNOSIS — M25551 Pain in right hip: Secondary | ICD-10-CM

## 2023-02-27 MED ORDER — PREDNISONE 10 MG (21) PO TBPK: ORAL_TABLET | Freq: Every day | ORAL | 0 refills | Status: AC

## 2023-02-27 MED ORDER — METHYLPREDNISOLONE ACETATE 80 MG/ML IJ SUSP
80.0000 mg | Freq: Once | INTRAMUSCULAR | Status: AC
  Administered 2023-02-27: 80 mg via INTRAMUSCULAR

## 2023-02-27 MED ORDER — KETOROLAC TROMETHAMINE 60 MG/2ML IM SOLN
60.0000 mg | Freq: Once | INTRAMUSCULAR | Status: AC
  Administered 2023-02-27: 60 mg via INTRAMUSCULAR

## 2023-02-27 MED ORDER — CELECOXIB 200 MG PO CAPS: 200.0000 mg | ORAL_CAPSULE | Freq: Two times a day (BID) | ORAL | 0 refills | Status: DC

## 2023-02-27 NOTE — Patient Instructions (Signed)
We are getting an xray today. We will be in contact with any abnormal results that require further attention.  You have received a steroid injection in the office today.  You have received an injection of Toradol in office today for pain.  Do not take any NSAIDs including aspirin, Aleve, ibuprofen, Celebrex, meloxicam within the next 6 hours after your injection.  Take by mouth daily for 6 days. Take 6 tablets on day 1, 5 tablets on day 2, 4 tablets on day 3, 3 tablets on day 4, 2 tablets on day 5, 1 tablet on day 6   I have sent in Celebrex for you to take twice a day as needed for pain.  If this is not enough, please let me know.  You may cut back on your cholesterol medicine for the next 2 weeks, I am not terribly convinced that it is the statin because I feel like you would have more diffuse pain.  Follow-up with me for new or worsening symptoms.

## 2023-02-27 NOTE — Progress Notes (Signed)
Acute Office Visit  Subjective:     Patient ID: Joanna Anthony, female    DOB: Dec 18, 1950, 73 y.o.   MRN: [id number]  Chief Complaint  Patient presents with   Hip Pain    Right hip, ongoing for 5 days, gets worse everyday, has tried ibuprofen/tylenol and heating pad no relief. Worse at night, walking difficulty    HPI Patient is in today for right hip pain that radiates to her buttock.  Reports that her right low back is also burning. Has been present for the last 5 days.  Has tried tylenol, motrin and heat with no relief. Denies numbness, tingling, loss of strength of the extremity. Denies previous symptoms. Inquiring if this could be related to her rosuvastatin.    ROS Per HPI      Objective:    BP (!) 160/90 (BP Location: Left Arm, Patient Position: Sitting, Cuff Size: Normal)   Pulse 72   Ht 5\' 7"  (1.702 m)   SpO2 99%   BMI 25.53 kg/m    Physical Exam Vitals and nursing note reviewed.  Constitutional:      Appearance: Normal appearance. She is normal weight.     Comments: In obvious discomfort  HENT:     Head: Normocephalic and atraumatic.  Eyes:     Extraocular Movements: Extraocular movements intact.     Pupils: Pupils are equal, round, and reactive to light.  Cardiovascular:     Rate and Rhythm: Normal rate and regular rhythm.     Heart sounds: Normal heart sounds.  Pulmonary:     Effort: Pulmonary effort is normal.     Breath sounds: Normal breath sounds.  Musculoskeletal:     Cervical back: Normal range of motion.       Legs:     Comments: Area of tenderness, no swelling, no erythema, no bruising, no obvious deformity.  Limited exam due to pain, limited ROM  Lymphadenopathy:     Cervical: No cervical adenopathy.  Skin:    General: Skin is warm and dry.  Neurological:     General: No focal deficit present.     Mental Status: She is alert and oriented to person, place, and time.  Psychiatric:        Mood and Affect: Mood normal.         Thought Content: Thought content normal.    No results found for any visits on 02/27/23.      Assessment & Plan:  1. Right hip pain (Primary)  - ketorolac (TORADOL) injection 60 mg - methylPREDNISolone acetate (DEPO-MEDROL) injection 80 mg - DG HIP UNILAT W OR W/O PELVIS 2-3 VIEWS RIGHT; Future - predniSONE (STERAPRED UNI-PAK 21 TAB) 10 MG (21) TBPK tablet; Take by mouth daily for 6 days. Take 6 tablets on day 1, 5 tablets on day 2, 4 tablets on day 3, 3 tablets on day 4, 2 tablets on day 5, 1 tablet on day 6  Dispense: 21 tablet; Refill: 0 - celecoxib (CELEBREX) 200 MG capsule; Take 1 capsule (200 mg total) by mouth 2 (two) times daily.  Dispense: 60 capsule; Refill: 0   Meds ordered this encounter  Medications   ketorolac (TORADOL) injection 60 mg   methylPREDNISolone acetate (DEPO-MEDROL) injection 80 mg   predniSONE (STERAPRED UNI-PAK 21 TAB) 10 MG (21) TBPK tablet    Sig: Take by mouth daily for 6 days. Take 6 tablets on day 1, 5 tablets on day 2, 4 tablets on day 3, 3 tablets on  day 4, 2 tablets on day 5, 1 tablet on day 6    Dispense:  21 tablet    Refill:  0   celecoxib (CELEBREX) 200 MG capsule    Sig: Take 1 capsule (200 mg total) by mouth 2 (two) times daily.    Dispense:  60 capsule    Refill:  0    Return if symptoms worsen or fail to improve.  Moshe Cipro, FNP

## 2023-03-26 ENCOUNTER — Other Ambulatory Visit: Payer: Self-pay | Admitting: Family Medicine

## 2023-03-26 DIAGNOSIS — M25551 Pain in right hip: Secondary | ICD-10-CM

## 2023-06-03 ENCOUNTER — Other Ambulatory Visit: Payer: Self-pay | Admitting: Internal Medicine

## 2023-06-03 DIAGNOSIS — I10 Essential (primary) hypertension: Secondary | ICD-10-CM

## 2023-06-03 DIAGNOSIS — E785 Hyperlipidemia, unspecified: Secondary | ICD-10-CM

## 2023-06-12 ENCOUNTER — Encounter: Payer: Self-pay | Admitting: Internal Medicine

## 2023-06-12 ENCOUNTER — Ambulatory Visit: Payer: Medicare Other | Admitting: Internal Medicine

## 2023-06-12 ENCOUNTER — Ambulatory Visit: Payer: Self-pay | Admitting: Internal Medicine

## 2023-06-12 VITALS — BP 138/82 | HR 63 | Temp 98.1°F | Resp 16 | Ht 67.0 in | Wt 167.2 lb

## 2023-06-12 DIAGNOSIS — E871 Hypo-osmolality and hyponatremia: Secondary | ICD-10-CM

## 2023-06-12 DIAGNOSIS — I10 Essential (primary) hypertension: Secondary | ICD-10-CM | POA: Diagnosis not present

## 2023-06-12 LAB — CBC WITH DIFFERENTIAL/PLATELET
Basophils Absolute: 0 10*3/uL (ref 0.0–0.1)
Basophils Relative: 0.8 % (ref 0.0–3.0)
Eosinophils Absolute: 0.2 10*3/uL (ref 0.0–0.7)
Eosinophils Relative: 3 % (ref 0.0–5.0)
HCT: 40.8 % (ref 36.0–46.0)
Hemoglobin: 14.1 g/dL (ref 12.0–15.0)
Lymphocytes Relative: 31.9 % (ref 12.0–46.0)
Lymphs Abs: 1.8 10*3/uL (ref 0.7–4.0)
MCHC: 34.5 g/dL (ref 30.0–36.0)
MCV: 85 fl (ref 78.0–100.0)
Monocytes Absolute: 0.5 10*3/uL (ref 0.1–1.0)
Monocytes Relative: 8.2 % (ref 3.0–12.0)
Neutro Abs: 3.2 10*3/uL (ref 1.4–7.7)
Neutrophils Relative %: 56.1 % (ref 43.0–77.0)
Platelets: 267 10*3/uL (ref 150.0–400.0)
RBC: 4.8 Mil/uL (ref 3.87–5.11)
RDW: 12.7 % (ref 11.5–15.5)
WBC: 5.8 10*3/uL (ref 4.0–10.5)

## 2023-06-12 LAB — BASIC METABOLIC PANEL WITH GFR
BUN: 9 mg/dL (ref 6–23)
CO2: 27 meq/L (ref 19–32)
Calcium: 9.3 mg/dL (ref 8.4–10.5)
Chloride: 96 meq/L (ref 96–112)
Creatinine, Ser: 0.85 mg/dL (ref 0.40–1.20)
GFR: 69.01 mL/min (ref >60.00)
Glucose, Bld: 107 mg/dL — ABNORMAL HIGH (ref 70–99)
Potassium: 4.3 meq/L (ref 3.5–5.1)
Sodium: 130 meq/L — ABNORMAL LOW (ref 135–145)

## 2023-06-12 MED ORDER — OLMESARTAN MEDOXOMIL 20 MG PO TABS
20.0000 mg | ORAL_TABLET | Freq: Every day | ORAL | 1 refills | Status: DC
Start: 2023-06-12 — End: 2023-12-14

## 2023-06-12 NOTE — Patient Instructions (Signed)
 Hypertension, Adult High blood pressure (hypertension) is when the force of blood pumping through the arteries is too strong. The arteries are the blood vessels that carry blood from the heart throughout the body. Hypertension forces the heart to work harder to pump blood and may cause arteries to become narrow or stiff. Untreated or uncontrolled hypertension can lead to a heart attack, heart failure, a stroke, kidney disease, and other problems. A blood pressure reading consists of a higher number over a lower number. Ideally, your blood pressure should be below 120/80. The first ("top") number is called the systolic pressure. It is a measure of the pressure in your arteries as your heart beats. The second ("bottom") number is called the diastolic pressure. It is a measure of the pressure in your arteries as the heart relaxes. What are the causes? The exact cause of this condition is not known. There are some conditions that result in high blood pressure. What increases the risk? Certain factors may make you more likely to develop high blood pressure. Some of these risk factors are under your control, including: Smoking. Not getting enough exercise or physical activity. Being overweight. Having too much fat, sugar, calories, or salt (sodium) in your diet. Drinking too much alcohol. Other risk factors include: Having a personal history of heart disease, diabetes, high cholesterol, or kidney disease. Stress. Having a family history of high blood pressure and high cholesterol. Having obstructive sleep apnea. Age. The risk increases with age. What are the signs or symptoms? High blood pressure may not cause symptoms. Very high blood pressure (hypertensive crisis) may cause: Headache. Fast or irregular heartbeats (palpitations). Shortness of breath. Nosebleed. Nausea and vomiting. Vision changes. Severe chest pain, dizziness, and seizures. How is this diagnosed? This condition is diagnosed by  measuring your blood pressure while you are seated, with your arm resting on a flat surface, your legs uncrossed, and your feet flat on the floor. The cuff of the blood pressure monitor will be placed directly against the skin of your upper arm at the level of your heart. Blood pressure should be measured at least twice using the same arm. Certain conditions can cause a difference in blood pressure between your right and left arms. If you have a high blood pressure reading during one visit or you have normal blood pressure with other risk factors, you may be asked to: Return on a different day to have your blood pressure checked again. Monitor your blood pressure at home for 1 week or longer. If you are diagnosed with hypertension, you may have other blood or imaging tests to help your health care provider understand your overall risk for other conditions. How is this treated? This condition is treated by making healthy lifestyle changes, such as eating healthy foods, exercising more, and reducing your alcohol intake. You may be referred for counseling on a healthy diet and physical activity. Your health care provider may prescribe medicine if lifestyle changes are not enough to get your blood pressure under control and if: Your systolic blood pressure is above 130. Your diastolic blood pressure is above 80. Your personal target blood pressure may vary depending on your medical conditions, your age, and other factors. Follow these instructions at home: Eating and drinking  Eat a diet that is high in fiber and potassium, and low in sodium, added sugar, and fat. An example of this eating plan is called the DASH diet. DASH stands for Dietary Approaches to Stop Hypertension. To eat this way: Eat  plenty of fresh fruits and vegetables. Try to fill one half of your plate at each meal with fruits and vegetables. Eat whole grains, such as whole-wheat pasta, brown rice, or whole-grain bread. Fill about one  fourth of your plate with whole grains. Eat or drink low-fat dairy products, such as skim milk or low-fat yogurt. Avoid fatty cuts of meat, processed or cured meats, and poultry with skin. Fill about one fourth of your plate with lean proteins, such as fish, chicken without skin, beans, eggs, or tofu. Avoid pre-made and processed foods. These tend to be higher in sodium, added sugar, and fat. Reduce your daily sodium intake. Many people with hypertension should eat less than 1,500 mg of sodium a day. Do not drink alcohol if: Your health care provider tells you not to drink. You are pregnant, may be pregnant, or are planning to become pregnant. If you drink alcohol: Limit how much you have to: 0-1 drink a day for women. 0-2 drinks a day for men. Know how much alcohol is in your drink. In the [place] one drink equals one 12 oz bottle of beer (355 mL), one 5 oz glass of wine (148 mL), or one 1 oz glass of hard liquor (44 mL). Lifestyle  Work with your health care provider to maintain a healthy body weight or to lose weight. Ask what an ideal weight is for you. Get at least 30 minutes of exercise that causes your heart to beat faster (aerobic exercise) most days of the week. Activities may include walking, swimming, or biking. Include exercise to strengthen your muscles (resistance exercise), such as Pilates or lifting weights, as part of your weekly exercise routine. Try to do these types of exercises for 30 minutes at least 3 days a week. Do not use any products that contain nicotine or tobacco. These products include cigarettes, chewing tobacco, and vaping devices, such as e-cigarettes. If you need help quitting, ask your health care provider. Monitor your blood pressure at home as told by your health care provider. Keep all follow-up visits. This is important. Medicines Take over-the-counter and prescription medicines only as told by your health care provider. Follow directions carefully. Blood  pressure medicines must be taken as prescribed. Do not skip doses of blood pressure medicine. Doing this puts you at risk for problems and can make the medicine less effective. Ask your health care provider about side effects or reactions to medicines that you should watch for. Contact a health care provider if you: Think you are having a reaction to a medicine you are taking. Have headaches that keep coming back (recurring). Feel dizzy. Have swelling in your ankles. Have trouble with your vision. Get help right away if you: Develop a severe headache or confusion. Have unusual weakness or numbness. Feel faint. Have severe pain in your chest or abdomen. Vomit repeatedly. Have trouble breathing. These symptoms may be an emergency. Get help right away. Call 911. Do not wait to see if the symptoms will go away. Do not drive yourself to the hospital. Summary Hypertension is when the force of blood pumping through your arteries is too strong. If this condition is not controlled, it may put you at risk for serious complications. Your personal target blood pressure may vary depending on your medical conditions, your age, and other factors. For most people, a normal blood pressure is less than 120/80. Hypertension is treated with lifestyle changes, medicines, or a combination of both. Lifestyle changes include losing weight, eating a healthy,  low-sodium diet, exercising more, and limiting alcohol. This information is not intended to replace advice given to you by your health care provider. Make sure you discuss any questions you have with your health care provider. Document Revised: 11/23/2020 Document Reviewed: 11/23/2020 Elsevier Patient Education  2024 [employer].

## 2023-06-12 NOTE — Progress Notes (Unsigned)
 Subjective:  Patient ID: Joanna Anthony, female    DOB: 14-Oct-1950  Age: 73 y.o. MRN: [id number]  CC: Medical Management of Chronic Issues (6 month f/u. She's concerned about her BP ), Hypertension, and Hyperlipidemia   HPI Joanna Anthony presents for f/up ---  Discussed the use of AI scribe software for clinical note transcription with the patient, who gave verbal consent to proceed.  History of Present Illness   Joanna Anthony is a 73 year old female with hypertension who presents with uncontrolled blood pressure and headaches.  She has been experiencing uncontrolled blood pressure with home readings fluctuating between 127/44 and 141/50. No associated symptoms such as chest pain, shortness of breath, dizziness, or lightheadedness. Her blood pressure tends to spike during office visits due to anxiety.  She experiences headaches, which she attributes to changes in the weather, particularly rain. No other symptoms accompany the headaches.  She recently resumed walking after a period of inactivity during the winter months. She feels fine during physical activity without any chest pain or shortness of breath.  She discontinued Crestor  in February due to severe muscle pain and difficulty walking, which resolved two weeks after stopping the medication. She is currently taking over-the-counter garlic supplements and is not interested in trying other statins.  She confirms being up to date on her tetanus vaccination but has not received a shingles vaccine and does not plan to do so. No recent gastrointestinal symptoms such as constipation, diarrhea, pain, or bloating.       Outpatient Medications Prior to Visit  Medication Sig Dispense Refill   Acetaminophen  500 MG capsule as needed.     Calcium  Carbonate Antacid (TUMS PO) TAKE 2 CHEWABLES FOR HEARTBURN AS NEEDED     celecoxib  (CELEBREX ) 200 MG capsule TAKE 1 CAPSULE BY MOUTH TWICE A DAY 60 capsule 0   cholecalciferol (VITAMIN D3) 25  MCG (1000 UT) tablet Take 2,000 Units by mouth daily.     Garlic (GARLIQUE PO) Take by mouth daily.     Magnesium Oxide 250 MG TABS Take by mouth.     olmesartan  (BENICAR ) 20 MG tablet Take 1 tablet (20 mg total) by mouth daily. 90 tablet 1   rosuvastatin  (CRESTOR ) 10 MG tablet Take 1 tablet (10 mg total) by mouth daily. 90 tablet 1   No facility-administered medications prior to visit.    ROS Review of Systems  Constitutional:  Positive for unexpected weight change (wt gain). Negative for appetite change, chills, diaphoresis and fatigue.  HENT: Negative.    Respiratory: Negative.  Negative for cough, chest tightness, shortness of breath and wheezing.   Cardiovascular:  Negative for chest pain, palpitations and leg swelling.  Gastrointestinal: Negative.  Negative for abdominal pain, constipation, diarrhea and nausea.  Genitourinary: Negative.  Negative for difficulty urinating.  Musculoskeletal: Negative.   Skin: Negative.   Neurological:  Positive for headaches. Negative for dizziness, weakness and light-headedness.  Hematological:  Negative for adenopathy. Does not bruise/bleed easily.  Psychiatric/Behavioral: Negative.      Objective:  BP 138/82 (BP Location: Left Arm, Patient Position: Sitting, Cuff Size: Normal)   Pulse 63   Temp 98.1 F (36.7 C) (Oral)   Resp 16   Ht 5\' 7"  (1.702 m)   Wt 167 lb 3.2 oz (75.8 kg)   SpO2 99%   BMI 26.19 kg/m   BP Readings from Last 3 Encounters:  06/12/23 138/82  02/27/23 (!) 160/90  12/13/22 (!) 144/82    Wt Readings from  Last 3 Encounters:  06/12/23 167 lb 3.2 oz (75.8 kg)  12/13/22 163 lb (73.9 kg)  05/10/22 156 lb (70.8 kg)    Physical Exam Vitals reviewed.  Constitutional:      Appearance: Normal appearance.  HENT:     Nose: Nose normal.     Mouth/Throat:     Mouth: Mucous membranes are moist.  Eyes:     General: No scleral icterus.    Conjunctiva/sclera: Conjunctivae normal.  Cardiovascular:     Rate and Rhythm:  Normal rate and regular rhythm.     Heart sounds: No murmur heard.    No friction rub. No gallop.  Pulmonary:     Effort: Pulmonary effort is normal.     Breath sounds: No stridor. No wheezing, rhonchi or rales.  Abdominal:     General: Abdomen is flat.     Palpations: There is no mass.     Tenderness: There is no abdominal tenderness. There is no guarding.     Hernia: No hernia is present.  Musculoskeletal:        General: Normal range of motion.     Cervical back: Neck supple.     Right lower leg: No edema.     Left lower leg: No edema.  Lymphadenopathy:     Cervical: No cervical adenopathy.  Skin:    General: Skin is warm and dry.  Neurological:     General: No focal deficit present.     Mental Status: She is alert. Mental status is at baseline.  Psychiatric:        Mood and Affect: Mood normal.        Behavior: Behavior normal.     Lab Results  Component Value Date   WBC 5.8 06/12/2023   HGB 14.1 06/12/2023   HCT 40.8 06/12/2023   PLT 267.0 06/12/2023   GLUCOSE 107 (H) 06/12/2023   CHOL 226 (H) 12/13/2022   TRIG 103.0 12/13/2022   HDL 68.60 12/13/2022   LDLDIRECT 135.7 01/12/2012   LDLCALC 137 (H) 12/13/2022   ALT 12 12/13/2022   AST 18 12/13/2022   NA 130 (L) 06/12/2023   K 4.3 06/12/2023   CL 96 06/12/2023   CREATININE 0.85 06/12/2023   BUN 9 06/12/2023   CO2 27 06/12/2023   TSH 1.30 12/13/2022   HGBA1C 5.5 01/09/2018    MM 3D SCREENING MAMMOGRAM BILATERAL BREAST Result Date: 02/05/2023 CLINICAL DATA:  Screening. EXAM: DIGITAL SCREENING BILATERAL MAMMOGRAM WITH TOMOSYNTHESIS AND CAD TECHNIQUE: Bilateral screening digital craniocaudal and mediolateral oblique mammograms were obtained. Bilateral screening digital breast tomosynthesis was performed. The images were evaluated with computer-aided detection. COMPARISON:  Previous exam(s). ACR Breast Density Category c: The breasts are heterogeneously dense, which may obscure small masses. FINDINGS: There are no  findings suspicious for malignancy. IMPRESSION: No mammographic evidence of malignancy. A result letter of this screening mammogram will be mailed directly to the patient. RECOMMENDATION: Screening mammogram in one year. (Code:SM-B-01Y) BI-RADS CATEGORY  1: Negative. Electronically Signed   By: Dobrinka  Dimitrova M.D.   On: 02/05/2023 16:01    Assessment & Plan:   Essential hypertension- She has not achieved her BP goal of 130/80. Will continue the current ARB dose and she will work on her lifestyle modifications. -     Basic metabolic panel with GFR; Future -     CBC with Differential/Platelet; Future -     Olmesartan  Medoxomil; Take 1 tablet (20 mg total) by mouth daily.  Dispense: 90 tablet; Refill: 1  Hyponatremia -     Sodium, urine, random; Future     Follow-up: Return in about 6 months (around 12/13/2023).  Sandra Crouch, MD

## 2023-06-15 LAB — SODIUM, URINE, RANDOM: Sodium, Ur: 74 mmol/L (ref 28–272)

## 2023-11-15 ENCOUNTER — Encounter: Payer: Self-pay | Admitting: Internal Medicine

## 2023-12-13 ENCOUNTER — Encounter: Payer: Self-pay | Admitting: Internal Medicine

## 2023-12-13 ENCOUNTER — Other Ambulatory Visit: Payer: Self-pay | Admitting: Internal Medicine

## 2023-12-13 ENCOUNTER — Ambulatory Visit: Admitting: Internal Medicine

## 2023-12-13 VITALS — BP 142/88 | HR 71 | Temp 98.0°F | Resp 16 | Ht 67.0 in | Wt 167.8 lb

## 2023-12-13 DIAGNOSIS — E871 Hypo-osmolality and hyponatremia: Secondary | ICD-10-CM

## 2023-12-13 DIAGNOSIS — E785 Hyperlipidemia, unspecified: Secondary | ICD-10-CM

## 2023-12-13 DIAGNOSIS — R001 Bradycardia, unspecified: Secondary | ICD-10-CM

## 2023-12-13 DIAGNOSIS — I10 Essential (primary) hypertension: Secondary | ICD-10-CM

## 2023-12-13 DIAGNOSIS — Z23 Encounter for immunization: Secondary | ICD-10-CM | POA: Insufficient documentation

## 2023-12-13 DIAGNOSIS — K219 Gastro-esophageal reflux disease without esophagitis: Secondary | ICD-10-CM

## 2023-12-13 DIAGNOSIS — Z1231 Encounter for screening mammogram for malignant neoplasm of breast: Secondary | ICD-10-CM

## 2023-12-13 DIAGNOSIS — N941 Unspecified dyspareunia: Secondary | ICD-10-CM | POA: Insufficient documentation

## 2023-12-13 LAB — CBC WITH DIFFERENTIAL/PLATELET
Basophils Absolute: 0 K/uL (ref 0.0–0.1)
Basophils Relative: 0.8 % (ref 0.0–3.0)
Eosinophils Absolute: 0.3 K/uL (ref 0.0–0.7)
Eosinophils Relative: 5.1 % — ABNORMAL HIGH (ref 0.0–5.0)
HCT: 41 % (ref 36.0–46.0)
Hemoglobin: 14.3 g/dL (ref 12.0–15.0)
Lymphocytes Relative: 36.6 % (ref 12.0–46.0)
Lymphs Abs: 1.9 K/uL (ref 0.7–4.0)
MCHC: 34.8 g/dL (ref 30.0–36.0)
MCV: 83.9 fl (ref 78.0–100.0)
Monocytes Absolute: 0.4 K/uL (ref 0.1–1.0)
Monocytes Relative: 8.5 % (ref 3.0–12.0)
Neutro Abs: 2.5 K/uL (ref 1.4–7.7)
Neutrophils Relative %: 49 % (ref 43.0–77.0)
Platelets: 219 K/uL (ref 150.0–400.0)
RBC: 4.89 Mil/uL (ref 3.87–5.11)
RDW: 12.8 % (ref 11.5–15.5)
WBC: 5.2 K/uL (ref 4.0–10.5)

## 2023-12-13 LAB — HEPATIC FUNCTION PANEL
ALT: 17 U/L (ref 0–35)
AST: 21 U/L (ref 0–37)
Albumin: 4.4 g/dL (ref 3.5–5.2)
Alkaline Phosphatase: 53 U/L (ref 39–117)
Bilirubin, Direct: 0.1 mg/dL (ref 0.0–0.3)
Total Bilirubin: 0.6 mg/dL (ref 0.2–1.2)
Total Protein: 7.3 g/dL (ref 6.0–8.3)

## 2023-12-13 LAB — URINALYSIS, ROUTINE W REFLEX MICROSCOPIC
Hgb urine dipstick: NEGATIVE
Nitrite: NEGATIVE
Specific Gravity, Urine: 1.025 (ref 1.000–1.030)
Total Protein, Urine: 30 — AB
Urine Glucose: NEGATIVE
Urobilinogen, UA: 1 (ref 0.0–1.0)
pH: 5.5 (ref 5.0–8.0)

## 2023-12-13 LAB — BASIC METABOLIC PANEL WITH GFR
BUN: 12 mg/dL (ref 6–23)
CO2: 26 meq/L (ref 19–32)
Calcium: 8.8 mg/dL (ref 8.4–10.5)
Chloride: 101 meq/L (ref 96–112)
Creatinine, Ser: 0.86 mg/dL (ref 0.40–1.20)
GFR: 67.81 mL/min (ref >60.00)
Glucose, Bld: 96 mg/dL (ref 70–99)
Potassium: 3.9 meq/L (ref 3.5–5.1)
Sodium: 136 meq/L (ref 135–145)

## 2023-12-13 LAB — LIPID PANEL
Cholesterol: 214 mg/dL — ABNORMAL HIGH (ref 0–200)
HDL: 72.5 mg/dL (ref >39.00)
LDL Cholesterol: 122 mg/dL — ABNORMAL HIGH (ref 0–99)
NonHDL: 141.38
Total CHOL/HDL Ratio: 3
Triglycerides: 96 mg/dL (ref 0.0–149.0)
VLDL: 19.2 mg/dL (ref 0.0–40.0)

## 2023-12-13 LAB — TSH: TSH: 1.74 u[IU]/mL (ref 0.35–5.50)

## 2023-12-13 MED ORDER — BOOSTRIX 5-2.5-18.5 LF-MCG/0.5 IM SUSP: 0.5000 mL | Freq: Once | INTRAMUSCULAR | 0 refills | Status: AC

## 2023-12-13 NOTE — Progress Notes (Signed)
 Subjective:  Patient ID: Joanna Anthony, female    DOB: [birth year]  Age: 73 y.o. MRN: [id number]  CC: Hypertension, Hyperlipidemia, and Gastroesophageal Reflux   HPI Joanna Anthony presents for f/up --  Discussed the use of AI scribe software for clinical note transcription with the patient, who gave verbal consent to proceed.  History of Present Illness Joanna Anthony is a 72 year old female who presents for a routine follow-up visit.  She experiences frequent heartburn, which is managed with Tums. She has no trouble swallowing or painful swallowing. No chest pain, shortness of breath, dizziness, or lightheadedness during physical activity.  She has a history of low sodium but denies symptoms such as dizziness or lightheadedness. No issues with urination, stating she can urinate anytime without difficulty or frequency.  She previously took a statin but discontinued it due to severe side effects, including an inability to walk and issues with her right side.  Her blood pressure readings at home have been around 143/89. She notes that her blood pressure tends to be high during visits but does not experience headaches or poor vision associated with it.  She has not been very physically active recently, describing herself as having gone 'to the slump.' However, she is active with her grandchildren, attending their sports events and helping with school activities. She is also busy with church activities.     Outpatient Medications Prior to Visit  Medication Sig Dispense Refill   Acetaminophen  500 MG capsule as needed.     Calcium  Carbonate Antacid (TUMS PO) TAKE 2 CHEWABLES FOR HEARTBURN AS NEEDED     cholecalciferol (VITAMIN D3) 25 MCG (1000 UT) tablet Take 2,000 Units by mouth daily.     Magnesium Oxide 250 MG TABS Take by mouth.     olmesartan  (BENICAR ) 20 MG tablet Take 1 tablet (20 mg total) by mouth daily. 90 tablet 1   celecoxib  (CELEBREX ) 200 MG capsule TAKE 1 CAPSULE BY  MOUTH TWICE A DAY 60 capsule 0   Garlic (GARLIQUE PO) Take by mouth daily.     No facility-administered medications prior to visit.    ROS Review of Systems  Constitutional:  Negative for appetite change, chills, diaphoresis, fatigue and fever.  HENT: Negative.  Negative for sore throat, trouble swallowing and voice change.   Eyes:  Negative for visual disturbance.  Respiratory: Negative.  Negative for cough, chest tightness, shortness of breath and wheezing.   Cardiovascular:  Negative for chest pain, palpitations and leg swelling.  Gastrointestinal:  Negative for abdominal pain, constipation, diarrhea, nausea and vomiting.  Endocrine: Negative for polyuria.  Genitourinary: Negative.  Negative for difficulty urinating and frequency.  Musculoskeletal: Negative.  Negative for arthralgias, joint swelling and myalgias.  Skin: Negative.   Neurological:  Negative for dizziness, weakness and light-headedness.  Hematological:  Negative for adenopathy. Does not bruise/bleed easily.  Psychiatric/Behavioral: Negative.      Objective:  BP (!) 142/88 (BP Location: Left Arm, Patient Position: Sitting, Cuff Size: Normal)   Pulse 71   Temp 98 F (36.7 C) (Oral)   Resp 16   Ht 5' 7 (1.702 m)   Wt 167 lb 12.8 oz (76.1 kg)   SpO2 97%   BMI 26.28 kg/m   BP Readings from Last 3 Encounters:  12/13/23 (!) 142/88  06/12/23 138/82  02/27/23 (!) 160/90    Wt Readings from Last 3 Encounters:  12/13/23 167 lb 12.8 oz (76.1 kg)  06/12/23 167 lb 3.2 oz (75.8 kg)  12/13/22 163 lb (73.9 kg)    Physical Exam Vitals reviewed.  Constitutional:      Appearance: Normal appearance.  HENT:     Nose: Nose normal.     Mouth/Throat:     Mouth: Mucous membranes are moist.  Eyes:     General: No scleral icterus.    Conjunctiva/sclera: Conjunctivae normal.  Cardiovascular:     Rate and Rhythm: Regular rhythm. Bradycardia present.     Heart sounds: Normal heart sounds, S1 normal and S2 normal. No  murmur heard.    Comments: EKG-- SB with 1st degree AV block (new), 55 bpm ST/T wave changes and inferior infarct pattern are not new Pulmonary:     Effort: Pulmonary effort is normal.     Breath sounds: No stridor. No wheezing, rhonchi or rales.  Abdominal:     General: Abdomen is flat.     Palpations: There is no mass.     Tenderness: There is no abdominal tenderness. There is no guarding.     Hernia: No hernia is present.  Musculoskeletal:     Cervical back: Neck supple.     Right lower leg: No edema.     Left lower leg: No edema.  Lymphadenopathy:     Cervical: No cervical adenopathy.  Skin:    General: Skin is warm and dry.  Neurological:     General: No focal deficit present.     Mental Status: She is alert.  Psychiatric:        Mood and Affect: Mood normal.        Behavior: Behavior normal.     Lab Results  Component Value Date   WBC 5.2 12/13/2023   HGB 14.3 12/13/2023   HCT 41.0 12/13/2023   PLT 219.0 12/13/2023   GLUCOSE 96 12/13/2023   CHOL 214 (H) 12/13/2023   TRIG 96.0 12/13/2023   HDL 72.50 12/13/2023   LDLDIRECT 135.7 01/12/2012   LDLCALC 122 (H) 12/13/2023   ALT 17 12/13/2023   AST 21 12/13/2023   NA 136 12/13/2023   K 3.9 12/13/2023   CL 101 12/13/2023   CREATININE 0.86 12/13/2023   BUN 12 12/13/2023   CO2 26 12/13/2023   TSH 1.74 12/13/2023   HGBA1C 5.5 01/09/2018    MM 3D SCREENING MAMMOGRAM BILATERAL BREAST Result Date: 02/05/2023 CLINICAL DATA:  Screening. EXAM: DIGITAL SCREENING BILATERAL MAMMOGRAM WITH TOMOSYNTHESIS AND CAD TECHNIQUE: Bilateral screening digital craniocaudal and mediolateral oblique mammograms were obtained. Bilateral screening digital breast tomosynthesis was performed. The images were evaluated with computer-aided detection. COMPARISON:  Previous exam(s). ACR Breast Density Category c: The breasts are heterogeneously dense, which may obscure small masses. FINDINGS: There are no findings suspicious for malignancy.  IMPRESSION: No mammographic evidence of malignancy. A result letter of this screening mammogram will be mailed directly to the patient. RECOMMENDATION: Screening mammogram in one year. (Code:SM-B-01Y) BI-RADS CATEGORY  1: Negative. Electronically Signed   By: Dobrinka  Dimitrova M.D.   On: 02/05/2023 16:01    Assessment & Plan:  Hyponatremia- Na+ is normal now. -     Basic metabolic panel with GFR; Future -     Sodium, urine, random; Future -     Urinalysis, Routine w reflex microscopic; Future  Need for prophylactic vaccination with combined diphtheria-tetanus-pertussis (DTP) vaccine -     Boostrix; Inject 0.5 mLs into the muscle once for 1 dose.  Dispense: 0.5 mL; Refill: 0  Hyperlipidemia LDL goal <130- She prefers not to take a statin. Will risk stratify  with a CCS. -     Lipid panel; Future -     CT CARDIAC SCORING ([facility] LOCATIONS ONLY); Future  Essential hypertension- Her BP is not at goal. She was encouraged to improve her lifestyle modifications. -     Hepatic function panel; Future -     Urinalysis, Routine w reflex microscopic; Future -     Olmesartan  Medoxomil; Take 1 tablet (20 mg total) by mouth daily.  Dispense: 90 tablet; Refill: 1  Gastroesophageal reflux disease without esophagitis- Controlled with tums. -     CBC with Differential/Platelet; Future  Bradycardia with 51-60 beats per minute- She is asx. -     TSH; Future -     EKG 12-Lead     Follow-up: Return in about 6 months (around 06/11/2024).  Debby Molt, MD

## 2023-12-13 NOTE — Patient Instructions (Signed)
 Hypertension, Adult High blood pressure (hypertension) is when the force of blood pumping through the arteries is too strong. The arteries are the blood vessels that carry blood from the heart throughout the body. Hypertension forces the heart to work harder to pump blood and may cause arteries to become narrow or stiff. Untreated or uncontrolled hypertension can lead to a heart attack, heart failure, a stroke, kidney disease, and other problems. A blood pressure reading consists of a higher number over a lower number. Ideally, your blood pressure should be below 120/80. The first ("top") number is called the systolic pressure. It is a measure of the pressure in your arteries as your heart beats. The second ("bottom") number is called the diastolic pressure. It is a measure of the pressure in your arteries as the heart relaxes. What are the causes? The exact cause of this condition is not known. There are some conditions that result in high blood pressure. What increases the risk? Certain factors may make you more likely to develop high blood pressure. Some of these risk factors are under your control, including: Smoking. Not getting enough exercise or physical activity. Being overweight. Having too much fat, sugar, calories, or salt (sodium) in your diet. Drinking too much alcohol. Other risk factors include: Having a personal history of heart disease, diabetes, high cholesterol, or kidney disease. Stress. Having a family history of high blood pressure and high cholesterol. Having obstructive sleep apnea. Age. The risk increases with age. What are the signs or symptoms? High blood pressure may not cause symptoms. Very high blood pressure (hypertensive crisis) may cause: Headache. Fast or irregular heartbeats (palpitations). Shortness of breath. Nosebleed. Nausea and vomiting. Vision changes. Severe chest pain, dizziness, and seizures. How is this diagnosed? This condition is diagnosed by  measuring your blood pressure while you are seated, with your arm resting on a flat surface, your legs uncrossed, and your feet flat on the floor. The cuff of the blood pressure monitor will be placed directly against the skin of your upper arm at the level of your heart. Blood pressure should be measured at least twice using the same arm. Certain conditions can cause a difference in blood pressure between your right and left arms. If you have a high blood pressure reading during one visit or you have normal blood pressure with other risk factors, you may be asked to: Return on a different day to have your blood pressure checked again. Monitor your blood pressure at home for 1 week or longer. If you are diagnosed with hypertension, you may have other blood or imaging tests to help your health care provider understand your overall risk for other conditions. How is this treated? This condition is treated by making healthy lifestyle changes, such as eating healthy foods, exercising more, and reducing your alcohol intake. You may be referred for counseling on a healthy diet and physical activity. Your health care provider may prescribe medicine if lifestyle changes are not enough to get your blood pressure under control and if: Your systolic blood pressure is above 130. Your diastolic blood pressure is above 80. Your personal target blood pressure may vary depending on your medical conditions, your age, and other factors. Follow these instructions at home: Eating and drinking  Eat a diet that is high in fiber and potassium, and low in sodium, added sugar, and fat. An example of this eating plan is called the DASH diet. DASH stands for Dietary Approaches to Stop Hypertension. To eat this way: Eat  plenty of fresh fruits and vegetables. Try to fill one half of your plate at each meal with fruits and vegetables. Eat whole grains, such as whole-wheat pasta, brown rice, or whole-grain bread. Fill about one  fourth of your plate with whole grains. Eat or drink low-fat dairy products, such as skim milk or low-fat yogurt. Avoid fatty cuts of meat, processed or cured meats, and poultry with skin. Fill about one fourth of your plate with lean proteins, such as fish, chicken without skin, beans, eggs, or tofu. Avoid pre-made and processed foods. These tend to be higher in sodium, added sugar, and fat. Reduce your daily sodium intake. Many people with hypertension should eat less than 1,500 mg of sodium a day. Do not drink alcohol if: Your health care provider tells you not to drink. You are pregnant, may be pregnant, or are planning to become pregnant. If you drink alcohol: Limit how much you have to: 0-1 drink a day for women. 0-2 drinks a day for men. Know how much alcohol is in your drink. In the [place] one drink equals one 12 oz bottle of beer (355 mL), one 5 oz glass of wine (148 mL), or one 1 oz glass of hard liquor (44 mL). Lifestyle  Work with your health care provider to maintain a healthy body weight or to lose weight. Ask what an ideal weight is for you. Get at least 30 minutes of exercise that causes your heart to beat faster (aerobic exercise) most days of the week. Activities may include walking, swimming, or biking. Include exercise to strengthen your muscles (resistance exercise), such as Pilates or lifting weights, as part of your weekly exercise routine. Try to do these types of exercises for 30 minutes at least 3 days a week. Do not use any products that contain nicotine or tobacco. These products include cigarettes, chewing tobacco, and vaping devices, such as e-cigarettes. If you need help quitting, ask your health care provider. Monitor your blood pressure at home as told by your health care provider. Keep all follow-up visits. This is important. Medicines Take over-the-counter and prescription medicines only as told by your health care provider. Follow directions carefully. Blood  pressure medicines must be taken as prescribed. Do not skip doses of blood pressure medicine. Doing this puts you at risk for problems and can make the medicine less effective. Ask your health care provider about side effects or reactions to medicines that you should watch for. Contact a health care provider if you: Think you are having a reaction to a medicine you are taking. Have headaches that keep coming back (recurring). Feel dizzy. Have swelling in your ankles. Have trouble with your vision. Get help right away if you: Develop a severe headache or confusion. Have unusual weakness or numbness. Feel faint. Have severe pain in your chest or abdomen. Vomit repeatedly. Have trouble breathing. These symptoms may be an emergency. Get help right away. Call 911. Do not wait to see if the symptoms will go away. Do not drive yourself to the hospital. Summary Hypertension is when the force of blood pumping through your arteries is too strong. If this condition is not controlled, it may put you at risk for serious complications. Your personal target blood pressure may vary depending on your medical conditions, your age, and other factors. For most people, a normal blood pressure is less than 120/80. Hypertension is treated with lifestyle changes, medicines, or a combination of both. Lifestyle changes include losing weight, eating a healthy,  low-sodium diet, exercising more, and limiting alcohol. This information is not intended to replace advice given to you by your health care provider. Make sure you discuss any questions you have with your health care provider. Document Revised: 11/23/2020 Document Reviewed: 11/23/2020 Elsevier Patient Education  2024 [employer].

## 2023-12-14 ENCOUNTER — Ambulatory Visit: Payer: Self-pay | Admitting: Internal Medicine

## 2023-12-14 LAB — SODIUM, URINE, RANDOM: Sodium, Ur: 36 mmol/L (ref 28–272)

## 2023-12-14 MED ORDER — OLMESARTAN MEDOXOMIL 20 MG PO TABS: 20.0000 mg | ORAL_TABLET | Freq: Every day | ORAL | 1 refills | Status: AC

## 2023-12-15 ENCOUNTER — Encounter: Payer: Self-pay | Admitting: Internal Medicine

## 2023-12-17 ENCOUNTER — Other Ambulatory Visit: Payer: Self-pay | Admitting: Internal Medicine

## 2023-12-21 ENCOUNTER — Ambulatory Visit
Admission: RE | Admit: 2023-12-21 | Discharge: 2023-12-21 | Disposition: A | Discharge status: Home / Self Care | Source: Ambulatory Visit | Attending: Internal Medicine | Admitting: Internal Medicine

## 2023-12-21 DIAGNOSIS — E785 Hyperlipidemia, unspecified: Secondary | ICD-10-CM

## 2024-02-04 ENCOUNTER — Ambulatory Visit
Admission: RE | Admit: 2024-02-04 | Discharge: 2024-02-04 | Disposition: A | Discharge status: Home / Self Care | Source: Ambulatory Visit | Attending: Internal Medicine | Admitting: Internal Medicine

## 2024-02-04 DIAGNOSIS — Z1231 Encounter for screening mammogram for malignant neoplasm of breast: Secondary | ICD-10-CM

## 2024-06-11 ENCOUNTER — Ambulatory Visit: Admitting: Internal Medicine
